# Patient Record
Sex: Female | Born: 1945 | Race: White | Hispanic: No | Marital: Married | Smoking: Never smoker
Health system: Southern US, Community
[De-identification: ages and names within clinical notes are randomized; demographics above are authoritative.]

## PROBLEM LIST (undated history)

## (undated) DIAGNOSIS — E785 Hyperlipidemia, unspecified: Secondary | ICD-10-CM

## (undated) DIAGNOSIS — E119 Type 2 diabetes mellitus without complications: Secondary | ICD-10-CM

## (undated) DIAGNOSIS — F419 Anxiety disorder, unspecified: Secondary | ICD-10-CM

## (undated) DIAGNOSIS — F32A Depression, unspecified: Secondary | ICD-10-CM

## (undated) DIAGNOSIS — F329 Major depressive disorder, single episode, unspecified: Secondary | ICD-10-CM

## (undated) DIAGNOSIS — Z8489 Family history of other specified conditions: Secondary | ICD-10-CM

## (undated) DIAGNOSIS — M1990 Unspecified osteoarthritis, unspecified site: Secondary | ICD-10-CM

## (undated) DIAGNOSIS — I739 Peripheral vascular disease, unspecified: Secondary | ICD-10-CM

## (undated) DIAGNOSIS — T8859XA Other complications of anesthesia, initial encounter: Secondary | ICD-10-CM

## (undated) DIAGNOSIS — M8580 Other specified disorders of bone density and structure, unspecified site: Secondary | ICD-10-CM

## (undated) DIAGNOSIS — I2510 Atherosclerotic heart disease of native coronary artery without angina pectoris: Secondary | ICD-10-CM

## (undated) DIAGNOSIS — G114 Hereditary spastic paraplegia: Secondary | ICD-10-CM

## (undated) DIAGNOSIS — T4145XA Adverse effect of unspecified anesthetic, initial encounter: Secondary | ICD-10-CM

## (undated) DIAGNOSIS — R112 Nausea with vomiting, unspecified: Secondary | ICD-10-CM

## (undated) DIAGNOSIS — Z9889 Other specified postprocedural states: Secondary | ICD-10-CM

## (undated) DIAGNOSIS — R251 Tremor, unspecified: Secondary | ICD-10-CM

## (undated) HISTORY — PX: EYE SURGERY: SHX253

## (undated) HISTORY — PX: FRACTURE SURGERY: SHX138

## (undated) HISTORY — PX: OTHER SURGICAL HISTORY: SHX169

## (undated) HISTORY — PX: TONSILLECTOMY: SUR1361

## (undated) HISTORY — PX: APPENDECTOMY: SHX54

## (undated) HISTORY — PX: COLONOSCOPY: SHX174

---
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
## 2012-02-14 ENCOUNTER — Ambulatory Visit: Payer: Self-pay | Admitting: Internal Medicine

## 2013-02-19 ENCOUNTER — Ambulatory Visit: Payer: Self-pay | Admitting: Internal Medicine

## 2013-05-30 ENCOUNTER — Ambulatory Visit: Payer: Self-pay | Admitting: Gastroenterology

## 2013-08-13 ENCOUNTER — Ambulatory Visit: Payer: Self-pay | Admitting: Ophthalmology

## 2013-08-28 ENCOUNTER — Ambulatory Visit: Payer: Self-pay | Admitting: Ophthalmology

## 2013-10-20 ENCOUNTER — Ambulatory Visit: Payer: Self-pay | Admitting: Otolaryngology

## 2014-02-21 ENCOUNTER — Ambulatory Visit: Payer: Self-pay | Admitting: Internal Medicine

## 2014-03-16 DIAGNOSIS — I779 Disorder of arteries and arterioles, unspecified: Secondary | ICD-10-CM | POA: Insufficient documentation

## 2014-03-16 DIAGNOSIS — E785 Hyperlipidemia, unspecified: Secondary | ICD-10-CM | POA: Insufficient documentation

## 2014-03-16 DIAGNOSIS — E119 Type 2 diabetes mellitus without complications: Secondary | ICD-10-CM | POA: Insufficient documentation

## 2014-03-16 DIAGNOSIS — E1169 Type 2 diabetes mellitus with other specified complication: Secondary | ICD-10-CM | POA: Insufficient documentation

## 2014-03-28 ENCOUNTER — Ambulatory Visit: Payer: Self-pay | Admitting: Internal Medicine

## 2014-06-10 DIAGNOSIS — G252 Other specified forms of tremor: Secondary | ICD-10-CM | POA: Insufficient documentation

## 2014-10-23 IMAGING — US THYROID ULTRASOUND
1 series · 14 of 25 positions shown · non-contrast
Comparison: None.

CLINICAL DATA: Thyroid nodule

EXAM:
THYROID ULTRASOUND
TECHNIQUE: Ultrasound examination of the thyroid gland and adjacent soft
tissues was performed.

[Series 1: thyroid ultrasound · 0.06mm/px · 14 of 56 slices shown]
[im 1/56]
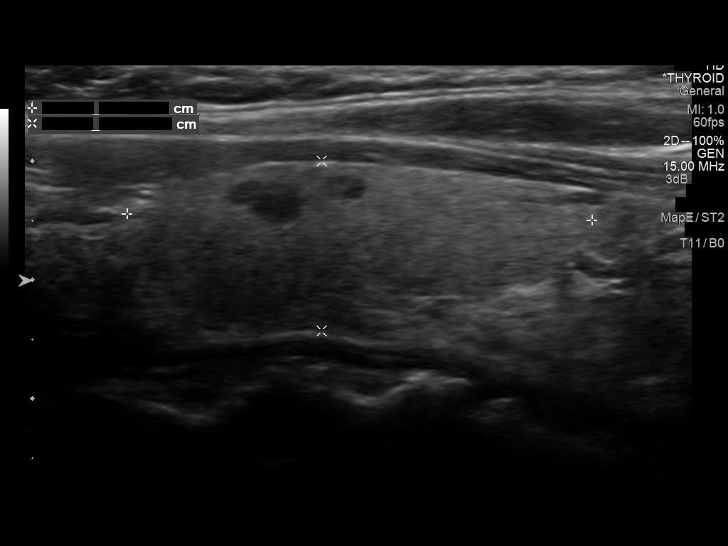
[im 5/56]
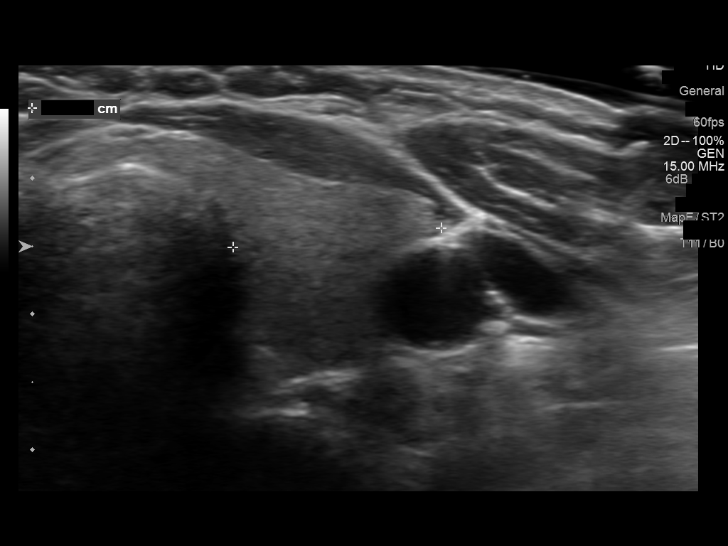
[im 10/56]
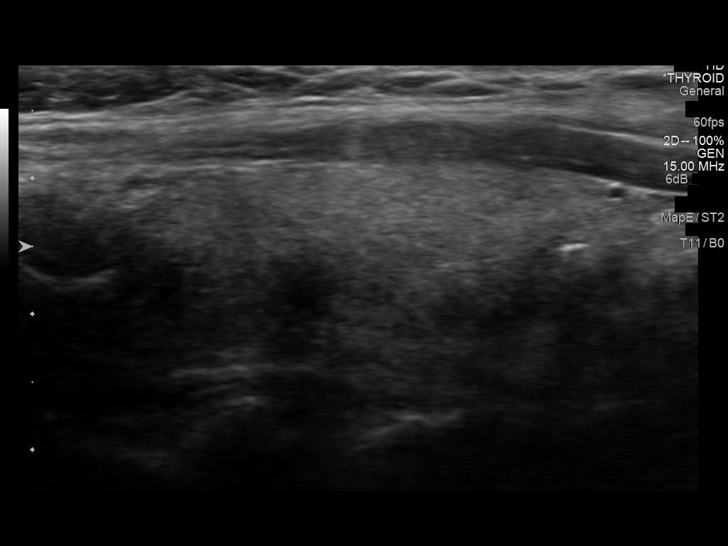
[im 14/56]
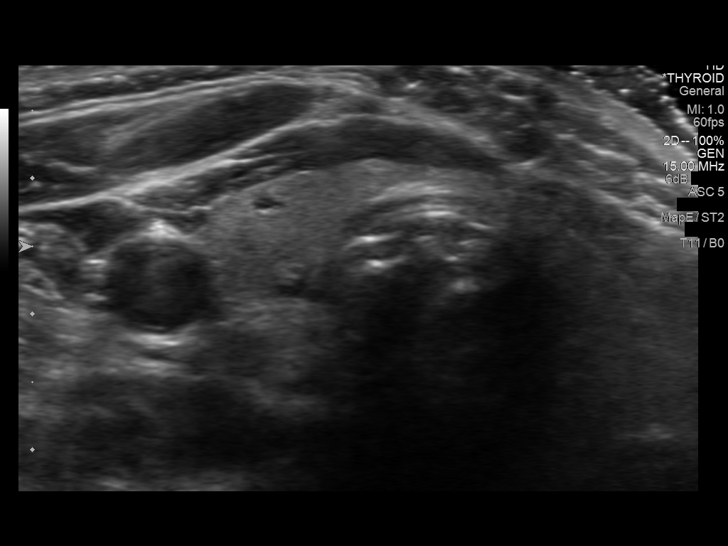
[im 19/56]
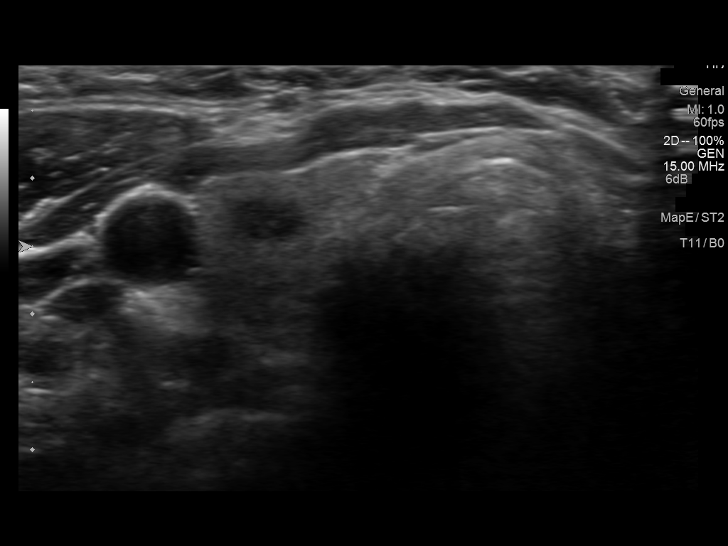
[im 21/56]
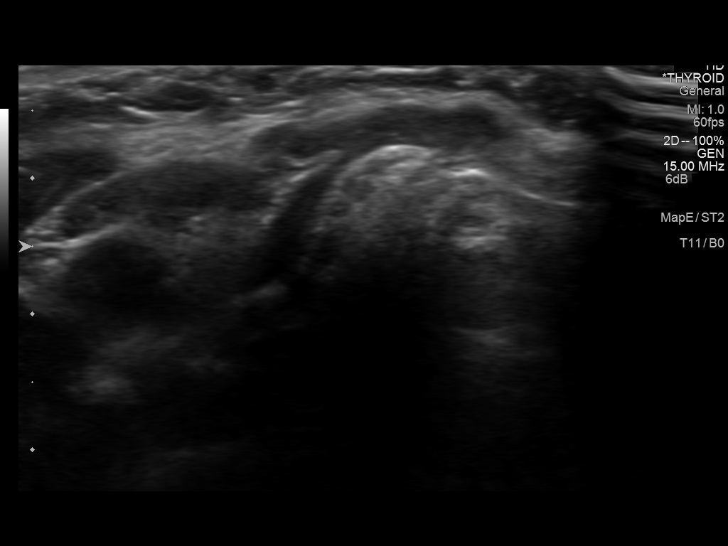
[im 26/56]
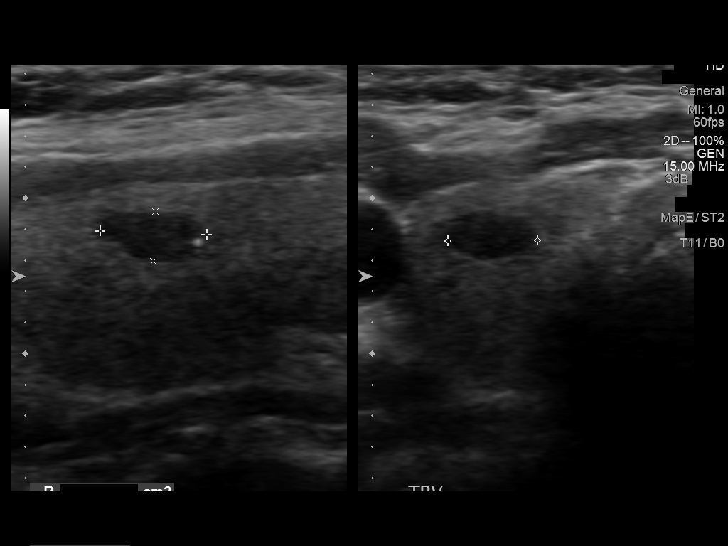
[im 30/56]
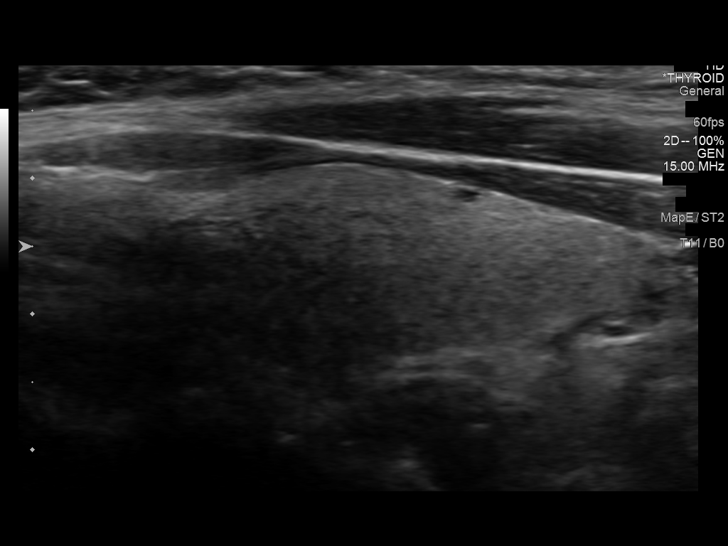
[im 35/56]
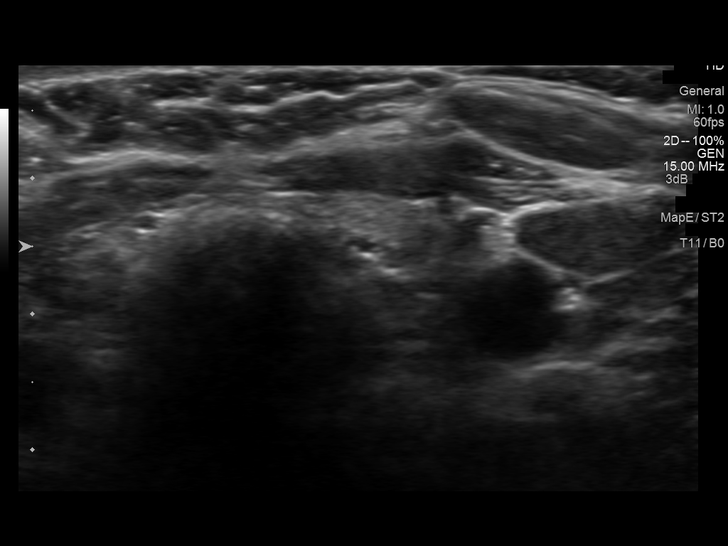
[im 37/56]
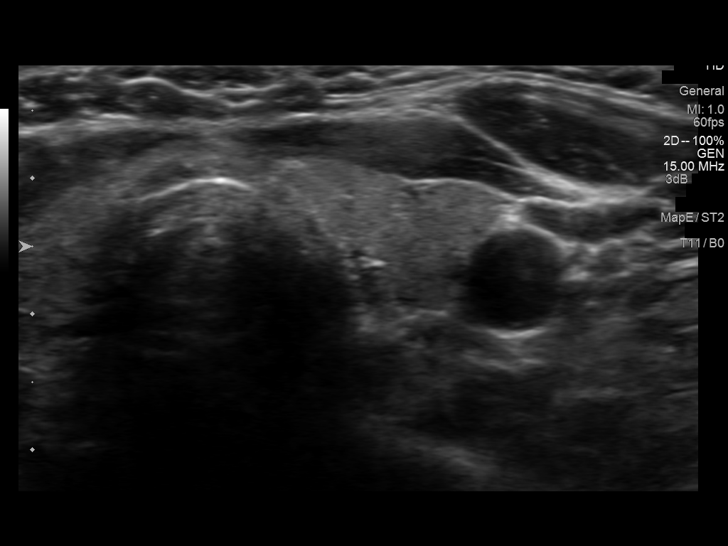
[im 42/56]
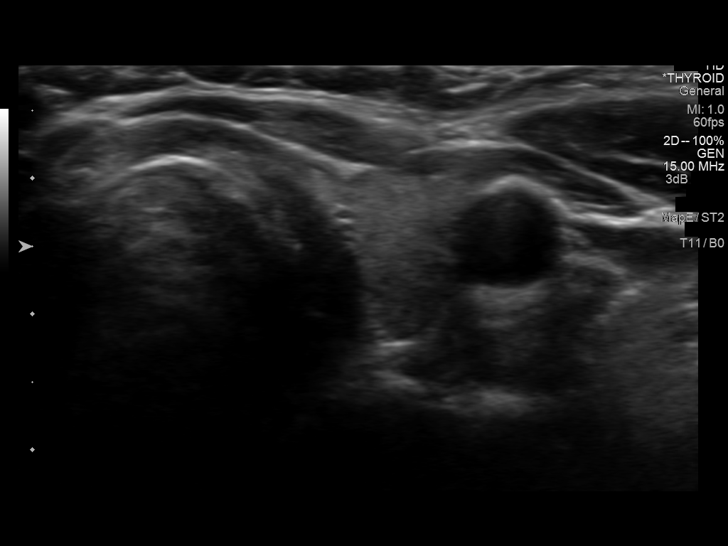
[im 46/56]
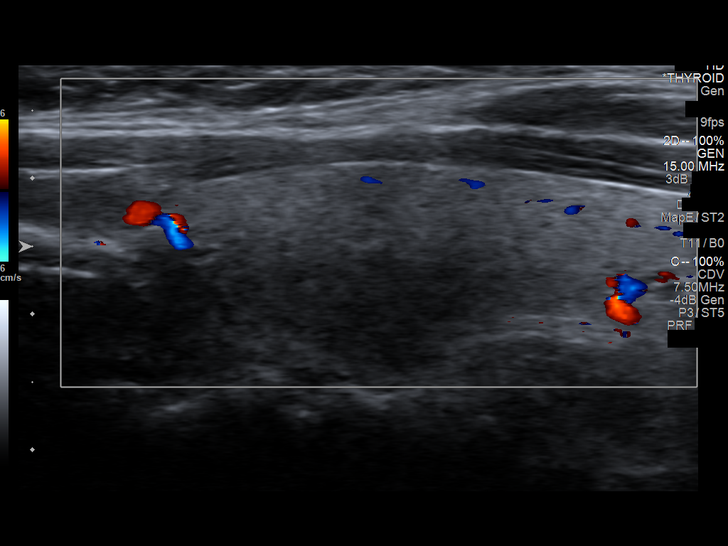
[im 51/56]
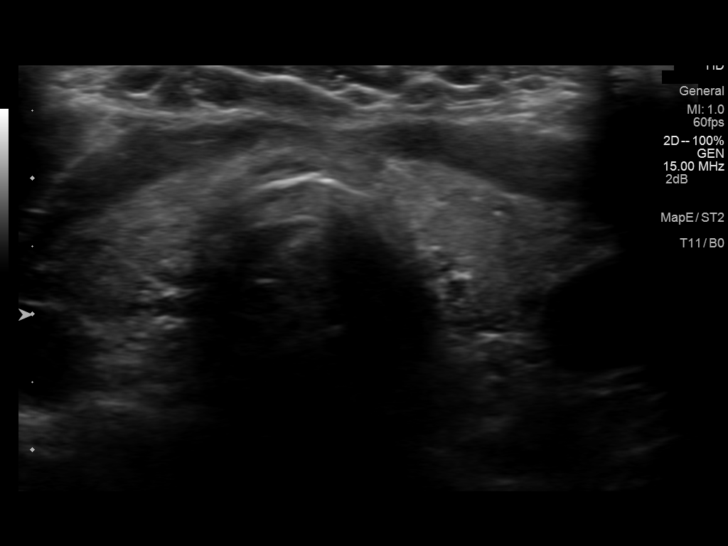
[im 56/56]
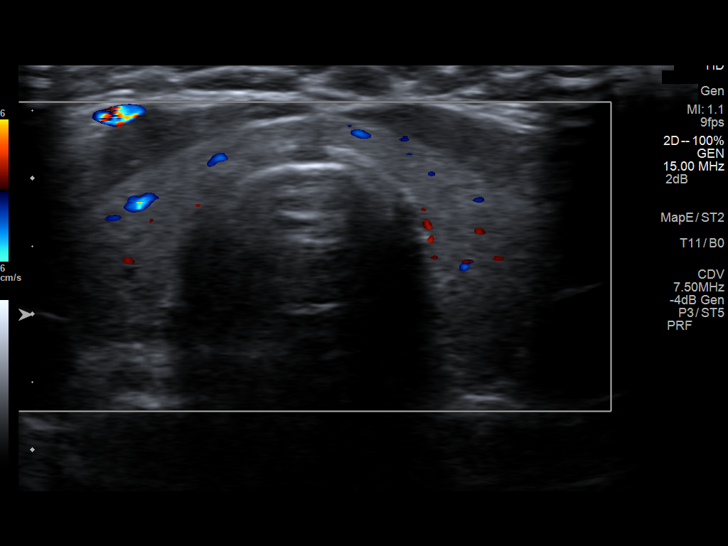

[14 of 25 positions shown; findings below may reference images not displayed]

FINDINGS: Right thyroid lobe

Measurements: 3.9 x 1.4 x 1.3 cm.. Scattered small hypoechoic
nodules are identified within the right lobe of the thyroid. The
largest of these measures 7 mm in lies in the upper pole.

Left thyroid lobe

Measurements: 4.0 x 1.4 x 1.5 cm.. Scattered nodules are noted in
the left lobe of the thyroid. The largest of these measures 6 mm in
the posterior aspect of the mid gland.

Isthmus

Thickness: 1.8 mm..  No nodules visualized.

Lymphadenopathy

None visualized.
IMPRESSION: Multiple small hypoechoic nodules.

Findings do not meet current SRU consensus criteria for biopsy.
Follow-up by clinical exam is recommended. If patient has known risk
factors for thyroid carcinoma, consider follow-up ultrasound in 12
months. If patient is clinically hyperthyroid, consider nuclear
medicine thyroid uptake and scan.

Reference: Management of Thyroid Nodules Detected at US: Society of
Radiologists in Ultrasound Consensus Conference Statement. Radiology

## 2015-03-25 ENCOUNTER — Other Ambulatory Visit: Payer: Self-pay | Admitting: Internal Medicine

## 2015-03-25 DIAGNOSIS — Z1231 Encounter for screening mammogram for malignant neoplasm of breast: Secondary | ICD-10-CM

## 2015-04-01 ENCOUNTER — Ambulatory Visit
Admission: RE | Admit: 2015-04-01 | Discharge: 2015-04-01 | Disposition: A | Discharge status: Home / Self Care | Payer: Medicare Other | Source: Ambulatory Visit | Attending: Internal Medicine | Admitting: Internal Medicine

## 2015-04-01 ENCOUNTER — Other Ambulatory Visit: Payer: Self-pay | Admitting: Internal Medicine

## 2015-04-01 DIAGNOSIS — Z1231 Encounter for screening mammogram for malignant neoplasm of breast: Secondary | ICD-10-CM | POA: Diagnosis not present

## 2015-10-28 DIAGNOSIS — Z8659 Personal history of other mental and behavioral disorders: Secondary | ICD-10-CM | POA: Insufficient documentation

## 2016-02-20 ENCOUNTER — Other Ambulatory Visit: Payer: Self-pay | Admitting: Internal Medicine

## 2016-02-20 DIAGNOSIS — Z1231 Encounter for screening mammogram for malignant neoplasm of breast: Secondary | ICD-10-CM

## 2016-04-13 ENCOUNTER — Ambulatory Visit
Admission: RE | Admit: 2016-04-13 | Discharge: 2016-04-13 | Disposition: A | Discharge status: Home / Self Care | Payer: Medicare Other | Source: Ambulatory Visit | Attending: Internal Medicine | Admitting: Internal Medicine

## 2016-04-13 ENCOUNTER — Other Ambulatory Visit: Payer: Self-pay | Admitting: Internal Medicine

## 2016-04-13 DIAGNOSIS — Z1231 Encounter for screening mammogram for malignant neoplasm of breast: Secondary | ICD-10-CM | POA: Diagnosis present

## 2016-04-26 DIAGNOSIS — M8589 Other specified disorders of bone density and structure, multiple sites: Secondary | ICD-10-CM | POA: Insufficient documentation

## 2016-06-05 ENCOUNTER — Emergency Department: Payer: Medicare Other

## 2016-06-05 ENCOUNTER — Encounter: Payer: Self-pay | Admitting: Emergency Medicine

## 2016-06-05 ENCOUNTER — Emergency Department
Admission: EM | Admit: 2016-06-05 | Discharge: 2016-06-05 | Disposition: A | Discharge status: Home / Self Care | Payer: Medicare Other | Attending: Student | Admitting: Student

## 2016-06-05 DIAGNOSIS — W010XXA Fall on same level from slipping, tripping and stumbling without subsequent striking against object, initial encounter: Secondary | ICD-10-CM | POA: Insufficient documentation

## 2016-06-05 DIAGNOSIS — S7002XA Contusion of left hip, initial encounter: Secondary | ICD-10-CM | POA: Diagnosis not present

## 2016-06-05 DIAGNOSIS — Y939 Activity, unspecified: Secondary | ICD-10-CM | POA: Insufficient documentation

## 2016-06-05 DIAGNOSIS — Y999 Unspecified external cause status: Secondary | ICD-10-CM | POA: Insufficient documentation

## 2016-06-05 DIAGNOSIS — Y929 Unspecified place or not applicable: Secondary | ICD-10-CM | POA: Insufficient documentation

## 2016-06-05 DIAGNOSIS — M25552 Pain in left hip: Secondary | ICD-10-CM

## 2016-06-05 DIAGNOSIS — W19XXXA Unspecified fall, initial encounter: Secondary | ICD-10-CM

## 2016-06-05 DIAGNOSIS — S79912A Unspecified injury of left hip, initial encounter: Secondary | ICD-10-CM | POA: Diagnosis present

## 2016-06-05 HISTORY — DX: Hereditary spastic paraplegia: G11.4

## 2016-06-05 HISTORY — DX: Hyperlipidemia, unspecified: E78.5

## 2016-06-05 HISTORY — DX: Unspecified osteoarthritis, unspecified site: M19.90

## 2016-06-05 HISTORY — DX: Anxiety disorder, unspecified: F41.9

## 2016-06-05 MED ORDER — ONDANSETRON 4 MG PO TBDP
ORAL_TABLET | ORAL | Status: AC
  Administered 2016-06-05: 4 mg via ORAL
  Filled 2016-06-05: qty 1

## 2016-06-05 MED ORDER — IBUPROFEN 600 MG PO TABS
ORAL_TABLET | ORAL | Status: AC
  Administered 2016-06-05: 600 mg via ORAL
  Filled 2016-06-05: qty 1

## 2016-06-05 MED ORDER — ONDANSETRON 4 MG PO TBDP
4.0000 mg | ORAL_TABLET | Freq: Once | ORAL | Status: AC
  Administered 2016-06-05: 4 mg via ORAL

## 2016-06-05 MED ORDER — CYCLOBENZAPRINE HCL 10 MG PO TABS
5.0000 mg | ORAL_TABLET | Freq: Once | ORAL | Status: AC
  Administered 2016-06-05: 5 mg via ORAL

## 2016-06-05 MED ORDER — IBUPROFEN 600 MG PO TABS
600.0000 mg | ORAL_TABLET | Freq: Once | ORAL | Status: AC
  Administered 2016-06-05: 600 mg via ORAL

## 2016-06-05 MED ORDER — IBUPROFEN 400 MG PO TABS: 400.0000 mg | ORAL_TABLET | Freq: Four times a day (QID) | ORAL | 0 refills | Status: DC | PRN

## 2016-06-05 MED ORDER — CYCLOBENZAPRINE HCL 10 MG PO TABS
ORAL_TABLET | ORAL | Status: AC
  Administered 2016-06-05: 5 mg via ORAL
  Filled 2016-06-05: qty 1

## 2016-06-05 NOTE — ED Provider Notes (Signed)
[facility] Emergency Department Provider Note   ____________________________________________   First MD Initiated Contact with Patient 06/05/16 1119     (approximate)  I have reviewed the triage vital signs and the nursing notes.   HISTORY  Chief Complaint Fall    HPI Joy Alexander is a 70 y.o. female with hyperlipidemia, hereditary spastic paraparesis who presents for evaluation of left hip pain after slip and fall which occurred suddenly just prior to arrival, constant, moderate and worse with movement of the left hip. Patient reports that she was sitting down having tea with several friends when she felt herself starting to slip off of a cushion she was sitting on. She tried to brace her fall by grabbing the table cloth but she fell onto the left hip. She not hit her head or lose consciousness. She has been able to bear weight on the left leg but is having difficulty with ambulation. She denies any other pain complaints. No chest pain or difficulty breathing, no abdominal pain, vomiting, diarrhea, fevers or chills per she is otherwise been in her usual state of health without illness.   Past Medical History:  Diagnosis Date  . Anxiety   . Arthritis   . Hereditary spastic paraparesis (HCC)   . Hyperlipemia     There are no active problems to display for this patient.   Past Surgical History:  Procedure Laterality Date  . APPENDECTOMY    . TONSILLECTOMY      Prior to Admission medications   Not on File    Allergies Review of patient's allergies indicates no known allergies.  Family History  Problem Relation Age of Onset  . Breast cancer Neg Hx     Social History Social History  Substance Use Topics  . Smoking status: Never Smoker  . Smokeless tobacco: Never Used  . Alcohol use Yes     Comment: 1-2 glasses of wine per week    Review of Systems Constitutional: No fever/chills Eyes: No visual changes. ENT: No sore  throat. Cardiovascular: Denies chest pain. Respiratory: Denies shortness of breath. Gastrointestinal: No abdominal pain.  No nausea, no vomiting.  No diarrhea.  No constipation. Genitourinary: Negative for dysuria. Musculoskeletal: Negative for back pain. Skin: Negative for rash. Neurological: Negative for headaches, focal weakness or numbness.  10-point ROS otherwise negative.  ____________________________________________   PHYSICAL EXAM:  VITAL SIGNS: ED Triage Vitals  Enc Vitals Group     BP 06/05/16 1116 (!) 180/88     Pulse Rate 06/05/16 1116 91     Resp 06/05/16 1116 18     Temp 06/05/16 1116 98.7 F (37.1 C)     Temp Source 06/05/16 1116 Oral     SpO2 06/05/16 1116 97 %     Weight 06/05/16 1117 127 lb (57.6 kg)     Height 06/05/16 1117 5\' 2"  (1.575 m)     Head Circumference --      Peak Flow --      Pain Score 06/05/16 1116 5     Pain Loc --      Pain Edu? --      Excl. in GC? --     Constitutional: Alert and oriented. Well appearing and in no acute distress. Eyes: Conjunctivae are normal. PERRL. EOMI. Head: Atraumatic. Nose: No congestion/rhinnorhea. Mouth/Throat: Mucous membranes are moist.  Oropharynx non-erythematous. Neck: No stridor.  No cervical spine tenderness to palpation. Cardiovascular: Normal rate, regular rhythm. Grossly normal heart sounds.  Good peripheral circulation.  Respiratory: Normal respiratory effort.  No retractions. Lungs CTAB. Gastrointestinal: Soft and nontender. No distention. No CVA tenderness. Genitourinary: deferred Musculoskeletal: Tenderness over the greater trochanter and the left thigh, full active range of motion of bilateral hip joints, 2+ DP pulses bilaterally, wiggles the toes. Pelvis is stable to rock and compression. No midline T or L-spine tenderness to palpation. Neurologic:  Normal speech and language. No gross focal neurologic deficits are appreciated. Skin:  Skin is warm, dry and intact. No rash noted. Psychiatric:  Mood and affect are normal. Speech and behavior are normal.  ____________________________________________   LABS (all labs ordered are listed, but only abnormal results are displayed)  Labs Reviewed - No data to display ____________________________________________  EKG  none ____________________________________________  RADIOLOGY  Xray left hip IMPRESSION: Bones appear somewhat osteoporotic. No fracture or dislocation. No evident arthropathy. ____________________________________________   PROCEDURES  Procedure(s) performed: None  Procedures  Critical Care performed: No  ____________________________________________   INITIAL IMPRESSION / ASSESSMENT AND PLAN / ED COURSE  Pertinent labs & imaging results that were available during my care of the patient were reviewed by me and considered in my medical decision making (see chart for details).  [name] is a 70 y.o. female with hyperlipidemia, hereditary spastic paraparesis who presents for evaluation of left hip pain after slip and fall which occurred suddenly just prior to arrival. On exam she is well-appearing and in no acute distress. Vital signs stable, she is afebrile. Neurovascularly intact in the left leg with full range of motion at the left hip, I doubt acute fracture or dislocation, possibly contusion given her point tenderness over the greater trochanter. We'll obtain plain films, treat her pain and reassess for disposition.  ----------------------------------------- 12:16 PM on 06/05/2016 ----------------------------------------- Plan films negative for acute fracture or dislocation. Patient ambulates well with a walker which she reports she has at home and uses periodically. I doubt an occult hip fracture. DC with return precautions and close PCP follow-up. She is comfortable with discharge plan.  Clinical Course     ____________________________________________   FINAL CLINICAL IMPRESSION(S)  / ED DIAGNOSES  Final diagnoses:  Fall, initial encounter  Acute hip pain, left      NEW MEDICATIONS STARTED DURING THIS VISIT:  New Prescriptions   No medications on file     Note:  This document was prepared using Dragon voice recognition software and may include unintentional dictation errors.    Gayla Doss, MD 06/05/16 [phone]

## 2016-06-05 NOTE — ED Triage Notes (Signed)
Patient slipped and fell this morning, landing on her left hip, no shortening or rotation noted, patient is able to bear weight, just not able to walk. Patient denies LOC or hitting head.

## 2016-06-05 NOTE — ED Notes (Signed)
Pt ambulated in hallway with walker without difficulty. EDP notified.

## 2016-06-05 NOTE — ED Notes (Signed)

## 2016-06-10 ENCOUNTER — Other Ambulatory Visit: Payer: Self-pay | Admitting: Internal Medicine

## 2016-06-10 DIAGNOSIS — M25552 Pain in left hip: Secondary | ICD-10-CM

## 2016-06-11 ENCOUNTER — Ambulatory Visit
Admission: RE | Admit: 2016-06-11 | Discharge: 2016-06-11 | Disposition: A | Discharge status: Home / Self Care | Payer: Medicare Other | Source: Ambulatory Visit | Attending: Internal Medicine | Admitting: Internal Medicine

## 2016-06-11 DIAGNOSIS — S32592A Other specified fracture of left pubis, initial encounter for closed fracture: Secondary | ICD-10-CM | POA: Diagnosis not present

## 2016-06-11 DIAGNOSIS — M25552 Pain in left hip: Secondary | ICD-10-CM | POA: Diagnosis present

## 2016-06-11 DIAGNOSIS — I700 Atherosclerosis of aorta: Secondary | ICD-10-CM | POA: Diagnosis not present

## 2016-06-11 DIAGNOSIS — X58XXXA Exposure to other specified factors, initial encounter: Secondary | ICD-10-CM | POA: Insufficient documentation

## 2017-04-28 ENCOUNTER — Other Ambulatory Visit: Payer: Self-pay | Admitting: Internal Medicine

## 2017-04-28 DIAGNOSIS — Z1231 Encounter for screening mammogram for malignant neoplasm of breast: Secondary | ICD-10-CM

## 2017-05-12 ENCOUNTER — Ambulatory Visit
Admission: RE | Admit: 2017-05-12 | Discharge: 2017-05-12 | Disposition: A | Discharge status: Home / Self Care | Payer: Medicare Other | Source: Ambulatory Visit | Attending: Internal Medicine | Admitting: Internal Medicine

## 2017-05-12 DIAGNOSIS — Z1231 Encounter for screening mammogram for malignant neoplasm of breast: Secondary | ICD-10-CM

## 2018-05-03 ENCOUNTER — Other Ambulatory Visit: Payer: Self-pay | Admitting: Internal Medicine

## 2018-05-03 DIAGNOSIS — Z1231 Encounter for screening mammogram for malignant neoplasm of breast: Secondary | ICD-10-CM

## 2018-05-15 ENCOUNTER — Ambulatory Visit
Admission: RE | Admit: 2018-05-15 | Discharge: 2018-05-15 | Disposition: A | Discharge status: Home / Self Care | Payer: Medicare Other | Source: Ambulatory Visit | Attending: Internal Medicine | Admitting: Internal Medicine

## 2018-05-15 DIAGNOSIS — Z1231 Encounter for screening mammogram for malignant neoplasm of breast: Secondary | ICD-10-CM | POA: Insufficient documentation

## 2018-05-30 DIAGNOSIS — Z9989 Dependence on other enabling machines and devices: Secondary | ICD-10-CM | POA: Insufficient documentation

## 2018-09-25 ENCOUNTER — Other Ambulatory Visit: Payer: Self-pay | Admitting: Surgery

## 2018-09-25 ENCOUNTER — Encounter: Payer: Self-pay | Admitting: *Deleted

## 2018-09-25 DIAGNOSIS — M7502 Adhesive capsulitis of left shoulder: Secondary | ICD-10-CM

## 2018-09-26 ENCOUNTER — Encounter: Admission: RE | Payer: Self-pay | Source: Home / Self Care

## 2018-09-26 ENCOUNTER — Ambulatory Visit: Admission: RE | Admit: 2018-09-26 | Payer: Medicare Other | Source: Home / Self Care | Admitting: Internal Medicine

## 2018-09-26 HISTORY — DX: Major depressive disorder, single episode, unspecified: F32.9

## 2018-09-26 HISTORY — DX: Other specified disorders of bone density and structure, unspecified site: M85.80

## 2018-09-26 HISTORY — DX: Type 2 diabetes mellitus without complications: E11.9

## 2018-09-26 HISTORY — DX: Depression, unspecified: F32.A

## 2018-09-26 HISTORY — DX: Atherosclerotic heart disease of native coronary artery without angina pectoris: I25.10

## 2018-09-26 SURGERY — COLONOSCOPY WITH PROPOFOL
Anesthesia: General

## 2018-10-04 ENCOUNTER — Ambulatory Visit
Admission: RE | Admit: 2018-10-04 | Discharge: 2018-10-04 | Disposition: A | Discharge status: Home / Self Care | Payer: Medicare Other | Source: Ambulatory Visit | Attending: Surgery | Admitting: Surgery

## 2018-10-04 DIAGNOSIS — M7502 Adhesive capsulitis of left shoulder: Secondary | ICD-10-CM | POA: Diagnosis not present

## 2018-11-21 ENCOUNTER — Encounter: Admission: RE | Payer: Self-pay | Source: Home / Self Care

## 2018-11-21 ENCOUNTER — Ambulatory Visit: Admission: RE | Admit: 2018-11-21 | Payer: Medicare Other | Source: Home / Self Care | Admitting: Internal Medicine

## 2018-11-21 SURGERY — COLONOSCOPY WITH PROPOFOL
Anesthesia: General

## 2019-03-04 DIAGNOSIS — Z01810 Encounter for preprocedural cardiovascular examination: Secondary | ICD-10-CM | POA: Diagnosis not present

## 2019-03-05 ENCOUNTER — Other Ambulatory Visit: Payer: Self-pay

## 2019-03-05 ENCOUNTER — Encounter
Admission: RE | Admit: 2019-03-05 | Discharge: 2019-03-05 | Disposition: A | Discharge status: Home / Self Care | Payer: Medicare Other | Source: Ambulatory Visit | Attending: Surgery | Admitting: Surgery

## 2019-03-05 DIAGNOSIS — S0003XA Contusion of scalp, initial encounter: Secondary | ICD-10-CM | POA: Diagnosis not present

## 2019-03-05 DIAGNOSIS — Z1159 Encounter for screening for other viral diseases: Secondary | ICD-10-CM | POA: Diagnosis not present

## 2019-03-05 DIAGNOSIS — Y92530 Ambulatory surgery center as the place of occurrence of the external cause: Secondary | ICD-10-CM | POA: Diagnosis not present

## 2019-03-05 DIAGNOSIS — Z5309 Procedure and treatment not carried out because of other contraindication: Secondary | ICD-10-CM | POA: Diagnosis not present

## 2019-03-05 DIAGNOSIS — W19XXXA Unspecified fall, initial encounter: Secondary | ICD-10-CM | POA: Diagnosis not present

## 2019-03-05 DIAGNOSIS — M7502 Adhesive capsulitis of left shoulder: Secondary | ICD-10-CM | POA: Diagnosis present

## 2019-03-05 DIAGNOSIS — Z9181 History of falling: Secondary | ICD-10-CM | POA: Diagnosis not present

## 2019-03-05 HISTORY — DX: Nausea with vomiting, unspecified: R11.2

## 2019-03-05 HISTORY — DX: Peripheral vascular disease, unspecified: I73.9

## 2019-03-05 HISTORY — DX: Other specified postprocedural states: Z98.890

## 2019-03-05 HISTORY — DX: Tremor, unspecified: R25.1

## 2019-03-05 HISTORY — DX: Family history of other specified conditions: Z84.89

## 2019-03-05 HISTORY — DX: Other complications of anesthesia, initial encounter: T88.59XA

## 2019-03-05 LAB — SARS CORONAVIRUS 2 (TAT 6-24 HRS): SARS Coronavirus 2: NEGATIVE

## 2019-03-05 NOTE — Patient Instructions (Signed)
Your procedure is scheduled on: thurs. 7/9 Report to Day Surgery. To find out your arrival time please call [phone] between 1PM - 3PM on Wed. 7/8.  Remember: Instructions that are not followed completely may result in serious medical risk,  up to and including death, or upon the discretion of your surgeon and anesthesiologist your  surgery may need to be rescheduled.     _X__ 1. Do not eat food after midnight the night before your procedure.                 No gum chewing or hard candies. You may drink clear liquids up to 2 hours                 before you are scheduled to arrive for your surgery- DO not drink clear                 liquids within 2 hours of the start of your surgery.                 Clear Liquids include:  water, apple juice without pulp, clear carbohydrate                 drink such as Clearfast of Gatorade, Black Coffee or Tea (Do not add                 anything to coffee or tea).  __X__2.  On the morning of surgery brush your teeth with toothpaste and water, you                may rinse your mouth with mouthwash if you wish.  Do not swallow any toothpaste of mouthwash.     _X__ 3.  No Alcohol for 24 hours before or after surgery.   ___ 4.  Do Not Smoke or use e-cigarettes For 24 Hours Prior to Your Surgery.                 Do not use any chewable tobacco products for at least 6 hours prior to                 surgery.  ____  5.  Bring all medications with you on the day of surgery if instructed.   _x___  6.  Notify your doctor if there is any change in your medical condition      (cold, fever, infections).     Do not wear jewelry, make-up, hairpins, clips or nail polish. Do not wear lotions, powders, or perfumes. You may wear deodorant. Do not shave 48 hours prior to surgery. Men may shave face and neck. Do not bring valuables to the hospital.    [facility] is not responsible for any belongings or valuables.  Contacts, dentures  or bridgework may not be worn into surgery. Leave your suitcase in the car. After surgery it may be brought to your room. For patients admitted to the hospital, discharge time is determined by your treatment team.   Patients discharged the day of surgery will not be allowed to drive home.   Please read over the following fact sheets that you were given:     _x___ Take these medicines the morning of surgery with A SIP OF WATER:    1. acetaminophen (TYLENOL 8 HOUR) 650 MG CR tablet  2. escitalopram (LEXAPRO) 20 MG tablet  3. primidone (MYSOLINE) 50 MG tablet  4.  5.  6.  ____ Fleet Enema (as directed)  __x__ Use CHG Soap as directed  ____ Use inhalers on the day of surgery  ____ Stop metformin 2 days prior to surgery    ____ Take 1/2 of usual insulin dose the night before surgery. No insulin the morning          of surgery.   _x___ Stop aspirin today   __x__ Stop Anti-inflammatories ibuprofen (ADVIL) 200 MG tablet today   ____ Stop supplements until after surgery.    ____ Bring C-Pap to the hospital.

## 2019-03-08 ENCOUNTER — Encounter: Payer: Self-pay | Admitting: Anesthesiology

## 2019-03-08 ENCOUNTER — Encounter
Admission: RE | Disposition: A | Discharge status: Home / Self Care | Payer: Self-pay | Source: Home / Self Care | Attending: Surgery

## 2019-03-08 ENCOUNTER — Encounter: Payer: Self-pay | Admitting: Emergency Medicine

## 2019-03-08 ENCOUNTER — Ambulatory Visit
Admission: RE | Admit: 2019-03-08 | Discharge: 2019-03-08 | Disposition: A | Discharge status: Home / Self Care | Payer: Medicare Other | Attending: Surgery | Admitting: Surgery

## 2019-03-08 ENCOUNTER — Other Ambulatory Visit: Payer: Self-pay

## 2019-03-08 DIAGNOSIS — Z5309 Procedure and treatment not carried out because of other contraindication: Secondary | ICD-10-CM | POA: Insufficient documentation

## 2019-03-08 DIAGNOSIS — M7502 Adhesive capsulitis of left shoulder: Secondary | ICD-10-CM | POA: Diagnosis not present

## 2019-03-08 DIAGNOSIS — S0003XA Contusion of scalp, initial encounter: Secondary | ICD-10-CM | POA: Insufficient documentation

## 2019-03-08 DIAGNOSIS — Z1159 Encounter for screening for other viral diseases: Secondary | ICD-10-CM | POA: Insufficient documentation

## 2019-03-08 DIAGNOSIS — W19XXXA Unspecified fall, initial encounter: Secondary | ICD-10-CM | POA: Insufficient documentation

## 2019-03-08 DIAGNOSIS — Z9181 History of falling: Secondary | ICD-10-CM | POA: Insufficient documentation

## 2019-03-08 DIAGNOSIS — Y92530 Ambulatory surgery center as the place of occurrence of the external cause: Secondary | ICD-10-CM | POA: Insufficient documentation

## 2019-03-08 LAB — GLUCOSE, CAPILLARY: Glucose-Capillary: 125 mg/dL — ABNORMAL HIGH (ref 70–99)

## 2019-03-08 SURGERY — CLOSED MANIPULATION SHOULDER WITH STEROID INJECTION
Anesthesia: Choice | Laterality: Left

## 2019-03-08 MED ORDER — FAMOTIDINE 20 MG PO TABS: 20.0000 mg | ORAL_TABLET | Freq: Once | ORAL | Status: DC

## 2019-03-08 MED ORDER — SODIUM CHLORIDE 0.9 % IV SOLN
INTRAVENOUS | Status: DC
  Administered 2019-03-08: 08:00:00 via INTRAVENOUS

## 2019-03-08 MED ORDER — SCOPOLAMINE 1 MG/3DAYS TD PT72: 1.0000 | MEDICATED_PATCH | Freq: Once | TRANSDERMAL | Status: DC

## 2019-03-08 SURGICAL SUPPLY — 47 items
BIT DRILL JUGRKNT W/NDL BIT2.9 (DRILL) IMPLANT
BLADE FULL RADIUS 3.5 (BLADE) ×3 IMPLANT
BNDG ADH 2 X3.75 FABRIC TAN LF (GAUZE/BANDAGES/DRESSINGS) ×3 IMPLANT
BUR ACROMIONIZER 4.0 (BURR) ×3 IMPLANT
CANNULA SHAVER 8MMX76MM (CANNULA) ×3 IMPLANT
CHLORAPREP W/TINT 26 (MISCELLANEOUS) ×3 IMPLANT
COVER MAYO STAND STRL (DRAPES) ×3 IMPLANT
COVER WAND RF STERILE (DRAPES) ×3 IMPLANT
DRAPE IMP U-DRAPE 54X76 (DRAPES) ×6 IMPLANT
DRILL JUGGERKNOT W/NDL BIT 2.9 (DRILL)
ELECT REM PT RETURN 9FT ADLT (ELECTROSURGICAL) ×3
ELECTRODE REM PT RTRN 9FT ADLT (ELECTROSURGICAL) ×1 IMPLANT
GAUZE SPONGE 4X4 12PLY STRL (GAUZE/BANDAGES/DRESSINGS) ×3 IMPLANT
GAUZE XEROFORM 1X8 LF (GAUZE/BANDAGES/DRESSINGS) ×3 IMPLANT
GLOVE BIO SURGEON STRL SZ7.5 (GLOVE) ×6 IMPLANT
GLOVE BIO SURGEON STRL SZ8 (GLOVE) ×6 IMPLANT
GLOVE BIOGEL PI IND STRL 8 (GLOVE) ×1 IMPLANT
GLOVE BIOGEL PI INDICATOR 8 (GLOVE) ×2
GLOVE INDICATOR 8.0 STRL GRN (GLOVE) ×3 IMPLANT
GOWN STRL REUS W/ TWL LRG LVL3 (GOWN DISPOSABLE) ×1 IMPLANT
GOWN STRL REUS W/ TWL XL LVL3 (GOWN DISPOSABLE) ×1 IMPLANT
GOWN STRL REUS W/TWL LRG LVL3 (GOWN DISPOSABLE) ×2
GOWN STRL REUS W/TWL XL LVL3 (GOWN DISPOSABLE) ×2
GRASPER SUT 15 45D LOW PRO (SUTURE) IMPLANT
IV LACTATED RINGER IRRG 3000ML (IV SOLUTION) ×2
IV LR IRRIG 3000ML ARTHROMATIC (IV SOLUTION) ×1 IMPLANT
KIT TURNOVER KIT A (KITS) ×3 IMPLANT
MANIFOLD NEPTUNE II (INSTRUMENTS) ×3 IMPLANT
MASK FACE SPIDER DISP (MASK) ×3 IMPLANT
MAT ABSORB  FLUID 56X50 GRAY (MISCELLANEOUS) ×2
MAT ABSORB FLUID 56X50 GRAY (MISCELLANEOUS) ×1 IMPLANT
NEEDLE HYPO 21X1.5 SAFETY (NEEDLE) ×3 IMPLANT
NEEDLE HYPO 22GX1.5 SAFETY (NEEDLE) ×3 IMPLANT
PACK ARTHROSCOPY SHOULDER (MISCELLANEOUS) ×3 IMPLANT
PAD ALCOHOL SWAB (MISCELLANEOUS) ×6 IMPLANT
SLING ARM LRG DEEP (SOFTGOODS) ×3 IMPLANT
SLING ARM M TX990204 (SOFTGOODS) ×3 IMPLANT
SLING ULTRA II LG (MISCELLANEOUS) ×3 IMPLANT
STAPLER SKIN PROX 35W (STAPLE) ×3 IMPLANT
STRAP SAFETY 5IN WIDE (MISCELLANEOUS) ×3 IMPLANT
SUT ETHIBOND 0 MO6 C/R (SUTURE) ×3 IMPLANT
SUT VIC AB 2-0 CT1 27 (SUTURE) ×4
SUT VIC AB 2-0 CT1 TAPERPNT 27 (SUTURE) ×2 IMPLANT
SYR 10ML LL (SYRINGE) ×3 IMPLANT
TAPE MICROFOAM 4IN (TAPE) ×3 IMPLANT
TUBING ARTHRO INFLOW-ONLY STRL (TUBING) ×3 IMPLANT
WAND WEREWOLF FLOW 90D (MISCELLANEOUS) ×3 IMPLANT

## 2019-03-08 NOTE — H&P (Signed)
While coming into the hospital this morning, the patient apparently was witnessed "crumpling to the ground" where she struck the back of her head on the floor.  Apparently she had taken a benzodiazepine on an empty stomach before coming into the hospital.  Although she did not lose consciousness, Anesthesia is concerned as to the patient having a possible concussion.  With general anesthesia, we would be unable to determine whether any grogginess would result from surgery or from an evolving issue resulting from her fall.  Given that she would not be able to be observed for any postconcussive symptoms, Anesthesia has recommended that we hold off on any surgery today.  Therefore, the patient will be canceled today and rescheduled for sometime in the near future.

## 2019-03-08 NOTE — Progress Notes (Signed)
Patient fell in the lobby at the screening desk in the medical mall around [place]. The screening nurse did witness the fall and said she crumpled to the ground and did strike her head. Patient wheeled to same day surgery. Patient was changed into a gown by this RN and Technical brewer. Patient did not have any visible injury to the body. Does have a hematoma to the back of the head with a knot. Patient states she falls a lot at home and usually tries to find something soft to land on. Patient states Dr Roland Rack gave her Ativan to take prior to her MRI she had prior to today and she took that pill prior to coming in on a empty stomach. Dr Roland Rack notified via his OR nurse. This RN spoke with Dr Lavone Neri anesthesia and updated him on the issue. Dr Lavone Neri spoke with patient and then with Dr Roland Rack and they agreed to cancel surgery. Dr Lavone Neri spoke with patient and told her the signs and symptoms to watch for and also spoke with husband and gave him the same instructions. Dr Roland Rack did come in and speak with patient. This RN did offer to take patient to the ED for evaluation but she declined and stated, "I feel fine to go home." Patients IV removed and changed back into her clothes. Patient stated they live in [facility] independent living and a nurse checks in on them. I instructed her to notify the nurse of today's events so they can check on her as well. Instructed patient to go to the ED if she had any of the symptoms Dr Lavone Neri discussed. Voiced understand.

## 2019-03-19 ENCOUNTER — Other Ambulatory Visit: Payer: Self-pay

## 2019-03-19 ENCOUNTER — Other Ambulatory Visit
Admission: RE | Admit: 2019-03-19 | Discharge: 2019-03-19 | Disposition: A | Discharge status: Home / Self Care | Payer: Medicare Other | Source: Ambulatory Visit | Attending: Surgery | Admitting: Surgery

## 2019-03-19 DIAGNOSIS — Z1159 Encounter for screening for other viral diseases: Secondary | ICD-10-CM | POA: Insufficient documentation

## 2019-03-20 LAB — SARS CORONAVIRUS 2 (TAT 6-24 HRS): SARS Coronavirus 2: NEGATIVE

## 2019-03-22 ENCOUNTER — Ambulatory Visit: Payer: Medicare Other | Admitting: Anesthesiology

## 2019-03-22 ENCOUNTER — Encounter
Admission: RE | Disposition: A | Discharge status: Home / Self Care | Payer: Self-pay | Source: Home / Self Care | Attending: Surgery

## 2019-03-22 ENCOUNTER — Ambulatory Visit
Admission: RE | Admit: 2019-03-22 | Discharge: 2019-03-22 | Disposition: A | Discharge status: Home / Self Care | Payer: Medicare Other | Attending: Surgery | Admitting: Surgery

## 2019-03-22 ENCOUNTER — Encounter: Payer: Self-pay | Admitting: *Deleted

## 2019-03-22 ENCOUNTER — Other Ambulatory Visit: Payer: Self-pay

## 2019-03-22 DIAGNOSIS — E785 Hyperlipidemia, unspecified: Secondary | ICD-10-CM | POA: Insufficient documentation

## 2019-03-22 DIAGNOSIS — Z7982 Long term (current) use of aspirin: Secondary | ICD-10-CM | POA: Diagnosis not present

## 2019-03-22 DIAGNOSIS — E1140 Type 2 diabetes mellitus with diabetic neuropathy, unspecified: Secondary | ICD-10-CM | POA: Insufficient documentation

## 2019-03-22 DIAGNOSIS — Z833 Family history of diabetes mellitus: Secondary | ICD-10-CM | POA: Diagnosis not present

## 2019-03-22 DIAGNOSIS — M7502 Adhesive capsulitis of left shoulder: Secondary | ICD-10-CM | POA: Diagnosis not present

## 2019-03-22 DIAGNOSIS — Z9849 Cataract extraction status, unspecified eye: Secondary | ICD-10-CM | POA: Insufficient documentation

## 2019-03-22 DIAGNOSIS — M810 Age-related osteoporosis without current pathological fracture: Secondary | ICD-10-CM | POA: Insufficient documentation

## 2019-03-22 DIAGNOSIS — Z79899 Other long term (current) drug therapy: Secondary | ICD-10-CM | POA: Diagnosis not present

## 2019-03-22 DIAGNOSIS — M1990 Unspecified osteoarthritis, unspecified site: Secondary | ICD-10-CM | POA: Insufficient documentation

## 2019-03-22 DIAGNOSIS — F329 Major depressive disorder, single episode, unspecified: Secondary | ICD-10-CM | POA: Diagnosis not present

## 2019-03-22 DIAGNOSIS — G114 Hereditary spastic paraplegia: Secondary | ICD-10-CM | POA: Diagnosis not present

## 2019-03-22 DIAGNOSIS — F419 Anxiety disorder, unspecified: Secondary | ICD-10-CM | POA: Insufficient documentation

## 2019-03-22 DIAGNOSIS — E1151 Type 2 diabetes mellitus with diabetic peripheral angiopathy without gangrene: Secondary | ICD-10-CM | POA: Diagnosis not present

## 2019-03-22 HISTORY — PX: CLOSED MANIPULATION SHOULDER WITH STERIOD INJECTION: SHX5611

## 2019-03-22 LAB — GLUCOSE, CAPILLARY: Glucose-Capillary: 121 mg/dL — ABNORMAL HIGH (ref 70–99)

## 2019-03-22 SURGERY — CLOSED MANIPULATION SHOULDER WITH STEROID INJECTION
Anesthesia: General | Site: Shoulder | Laterality: Left

## 2019-03-22 MED ORDER — FENTANYL CITRATE (PF) 100 MCG/2ML IJ SOLN
INTRAMUSCULAR | Status: AC
  Administered 2019-03-22: 25 ug via INTRAVENOUS
  Filled 2019-03-22: qty 2

## 2019-03-22 MED ORDER — LIDOCAINE HCL (PF) 1 % IJ SOLN
INTRAMUSCULAR | Status: AC
  Filled 2019-03-22: qty 5

## 2019-03-22 MED ORDER — FENTANYL CITRATE (PF) 100 MCG/2ML IJ SOLN
25.0000 ug | Freq: Once | INTRAMUSCULAR | Status: AC
  Administered 2019-03-22: 10:00:00 25 ug via INTRAVENOUS

## 2019-03-22 MED ORDER — ROPIVACAINE HCL 5 MG/ML IJ SOLN
INTRAMUSCULAR | Status: AC
  Filled 2019-03-22: qty 30

## 2019-03-22 MED ORDER — FENTANYL CITRATE (PF) 100 MCG/2ML IJ SOLN
INTRAMUSCULAR | Status: AC
  Filled 2019-03-22: qty 2

## 2019-03-22 MED ORDER — BUPIVACAINE-EPINEPHRINE (PF) 0.25% -1:200000 IJ SOLN
INTRAMUSCULAR | Status: DC | PRN
  Administered 2019-03-22: 9 mL via PERINEURAL

## 2019-03-22 MED ORDER — MIDAZOLAM HCL 2 MG/2ML IJ SOLN
INTRAMUSCULAR | Status: AC
  Administered 2019-03-22: 1 mg via INTRAVENOUS
  Filled 2019-03-22: qty 2

## 2019-03-22 MED ORDER — LIDOCAINE HCL (PF) 1 % IJ SOLN
INTRAMUSCULAR | Status: DC | PRN
  Administered 2019-03-22: .8 mL

## 2019-03-22 MED ORDER — ONDANSETRON HCL 4 MG/2ML IJ SOLN
4.0000 mg | Freq: Once | INTRAMUSCULAR | Status: AC | PRN
  Administered 2019-03-22: 4 mg via INTRAVENOUS

## 2019-03-22 MED ORDER — PROPOFOL 10 MG/ML IV BOLUS
INTRAVENOUS | Status: DC | PRN
  Administered 2019-03-22 (×2): 30 mg via INTRAVENOUS

## 2019-03-22 MED ORDER — ONDANSETRON HCL 4 MG/2ML IJ SOLN
INTRAMUSCULAR | Status: AC
  Filled 2019-03-22: qty 2

## 2019-03-22 MED ORDER — MIDAZOLAM HCL 2 MG/2ML IJ SOLN
1.0000 mg | Freq: Once | INTRAMUSCULAR | Status: AC
  Administered 2019-03-22: 1 mg via INTRAVENOUS

## 2019-03-22 MED ORDER — CEFAZOLIN SODIUM-DEXTROSE 2-4 GM/100ML-% IV SOLN
INTRAVENOUS | Status: AC
  Filled 2019-03-22: qty 100

## 2019-03-22 MED ORDER — CEFAZOLIN SODIUM-DEXTROSE 2-4 GM/100ML-% IV SOLN: 2.0000 g | Freq: Once | INTRAVENOUS | Status: DC

## 2019-03-22 MED ORDER — TRIAMCINOLONE ACETONIDE 40 MG/ML IJ SUSP
INTRAMUSCULAR | Status: AC
  Filled 2019-03-22: qty 1

## 2019-03-22 MED ORDER — SODIUM CHLORIDE 0.9 % IV SOLN
Freq: Once | INTRAVENOUS | Status: AC
  Administered 2019-03-22: 09:00:00 via INTRAVENOUS

## 2019-03-22 MED ORDER — PROPOFOL 10 MG/ML IV BOLUS
INTRAVENOUS | Status: AC
  Filled 2019-03-22: qty 20

## 2019-03-22 MED ORDER — LACTATED RINGERS IV SOLN
INTRAVENOUS | Status: DC | PRN
  Administered 2019-03-22: 10:00:00 via INTRAVENOUS

## 2019-03-22 MED ORDER — FENTANYL CITRATE (PF) 100 MCG/2ML IJ SOLN
INTRAMUSCULAR | Status: DC | PRN
  Administered 2019-03-22: 50 ug via INTRAVENOUS

## 2019-03-22 MED ORDER — MIDAZOLAM HCL 2 MG/2ML IJ SOLN
INTRAMUSCULAR | Status: AC
  Filled 2019-03-22: qty 2

## 2019-03-22 MED ORDER — BUPIVACAINE-EPINEPHRINE (PF) 0.25% -1:200000 IJ SOLN
INTRAMUSCULAR | Status: AC
  Filled 2019-03-22: qty 30

## 2019-03-22 MED ORDER — ROPIVACAINE HCL 5 MG/ML IJ SOLN
INTRAMUSCULAR | Status: DC | PRN
  Administered 2019-03-22: 30 mL via PERINEURAL

## 2019-03-22 MED ORDER — TRIAMCINOLONE ACETONIDE 40 MG/ML IJ SUSP
INTRAMUSCULAR | Status: DC | PRN
  Administered 2019-03-22: 1 mg via INTRAMUSCULAR

## 2019-03-22 MED ORDER — EPINEPHRINE PF 1 MG/ML IJ SOLN
INTRAMUSCULAR | Status: AC
  Filled 2019-03-22: qty 1

## 2019-03-22 MED ORDER — MIDAZOLAM HCL 2 MG/2ML IJ SOLN
INTRAMUSCULAR | Status: DC | PRN
  Administered 2019-03-22: 2 mg via INTRAVENOUS

## 2019-03-22 SURGICAL SUPPLY — 48 items
BIT DRILL JUGRKNT W/NDL BIT2.9 (DRILL) IMPLANT
BLADE FULL RADIUS 3.5 (BLADE) ×1 IMPLANT
BNDG ADH 2 X3.75 FABRIC TAN LF (GAUZE/BANDAGES/DRESSINGS) ×1 IMPLANT
BUR ACROMIONIZER 4.0 (BURR) ×1 IMPLANT
CANNULA SHAVER 8MMX76MM (CANNULA) ×1 IMPLANT
CHLORAPREP W/TINT 26 (MISCELLANEOUS) ×1 IMPLANT
COVER MAYO STAND REUSABLE (DRAPES) ×1 IMPLANT
COVER WAND RF STERILE (DRAPES) ×1 IMPLANT
DRAPE SPLIT 6X30 W/TAPE (DRAPES) ×2 IMPLANT
DRILL JUGGERKNOT W/NDL BIT 2.9 (DRILL)
ELECT REM PT RETURN 9FT ADLT (ELECTROSURGICAL)
ELECTRODE REM PT RTRN 9FT ADLT (ELECTROSURGICAL) ×1 IMPLANT
GAUZE SPONGE 4X4 12PLY STRL (GAUZE/BANDAGES/DRESSINGS) ×1 IMPLANT
GAUZE XEROFORM 1X8 LF (GAUZE/BANDAGES/DRESSINGS) ×1 IMPLANT
GLOVE BIO SURGEON STRL SZ7.5 (GLOVE) ×2 IMPLANT
GLOVE BIO SURGEON STRL SZ8 (GLOVE) ×2 IMPLANT
GLOVE BIOGEL PI IND STRL 8 (GLOVE) ×1 IMPLANT
GLOVE BIOGEL PI INDICATOR 8 (GLOVE)
GLOVE INDICATOR 8.0 STRL GRN (GLOVE) ×1 IMPLANT
GOWN STRL REUS W/ TWL LRG LVL3 (GOWN DISPOSABLE) ×1 IMPLANT
GOWN STRL REUS W/ TWL XL LVL3 (GOWN DISPOSABLE) ×1 IMPLANT
GOWN STRL REUS W/TWL LRG LVL3 (GOWN DISPOSABLE)
GOWN STRL REUS W/TWL XL LVL3 (GOWN DISPOSABLE)
GRASPER SUT 15 45D LOW PRO (SUTURE) IMPLANT
IV LACTATED RINGER IRRG 3000ML (IV SOLUTION)
IV LR IRRIG 3000ML ARTHROMATIC (IV SOLUTION) ×1 IMPLANT
KIT TURNOVER KIT A (KITS) ×1 IMPLANT
MANIFOLD NEPTUNE II (INSTRUMENTS) ×1 IMPLANT
MASK FACE SPIDER DISP (MASK) ×1 IMPLANT
MAT ABSORB  FLUID 56X50 GRAY (MISCELLANEOUS)
MAT ABSORB FLUID 56X50 GRAY (MISCELLANEOUS) ×1 IMPLANT
NDL HYPO 21X1.5 SAFETY (NEEDLE) ×1 IMPLANT
NEEDLE HYPO 21X1.5 SAFETY (NEEDLE) IMPLANT
NEEDLE HYPO 22GX1.5 SAFETY (NEEDLE) ×3 IMPLANT
PACK ARTHROSCOPY SHOULDER (MISCELLANEOUS) ×1 IMPLANT
PAD ALCOHOL SWAB (MISCELLANEOUS) ×4 IMPLANT
SLING ARM LRG DEEP (SOFTGOODS) ×1 IMPLANT
SLING ARM M TX990204 (SOFTGOODS) ×3 IMPLANT
SLING ULTRA II LG (MISCELLANEOUS) ×1 IMPLANT
STAPLER SKIN PROX 35W (STAPLE) ×1 IMPLANT
STRAP SAFETY 5IN WIDE (MISCELLANEOUS) ×1 IMPLANT
SUT ETHIBOND 0 MO6 C/R (SUTURE) ×1 IMPLANT
SUT VIC AB 2-0 CT1 27 (SUTURE)
SUT VIC AB 2-0 CT1 TAPERPNT 27 (SUTURE) ×2 IMPLANT
SYR 10ML LL (SYRINGE) ×1 IMPLANT
TAPE MICROFOAM 4IN (TAPE) ×1 IMPLANT
TUBING ARTHRO INFLOW-ONLY STRL (TUBING) ×1 IMPLANT
WAND WEREWOLF FLOW 90D (MISCELLANEOUS) ×1 IMPLANT

## 2019-03-22 NOTE — Op Note (Signed)
03/22/2019  10:40 AM  Patient:   Joy Alexander  Pre-Op Diagnosis:   Primary adhesive capsulitis, left shoulder.  Post-Op Diagnosis:   Same  Procedure:   Manipulation under anesthesia with steroid injection, left shoulder.  Surgeon:   Pascal Lux, MD  Assistant:   None  Anesthesia:   IV sedation with interscalene block placed preoperatively by anesthesiologist.  Findings:   As above. Prior to manipulation, the left shoulder could be forward flexed to 150 and abducted to 135. At 90 of abduction, the shoulder could be externally rotated to 70 and internally rotated to 40. Following manipulation, the shoulder could be forward flexed to 170, abducted to 170 and, at 90 of abduction, externally rotated to 90 and internally rotated to 65.  Complications:   None  EBL:   0 cc  Fluids:   200 cc crystalloid  TT:   None  Drains:   None  Closure:   None  Brief Clinical Note:   The patient is a 73 year old female with a history of left shoulder pain and limited motion. Despite extensive physical therapy, the patient continues to have difficulty regaining shoulder range of motion. The patient's history and examination are consistent with primary adhesive capsulitis. The patient presents at this time for a manipulation under anesthesia with steroid injection of the left shoulder.  Procedure:   The patient underwent placement of an interscalene block in the preoperative holding area before being brought into the operating room and lain in the supine position. After adequate IV sedation was achieved, a timeout was performed to verify the correct surgical site. The left shoulder was gently manipulated in both abduction and external rotation, as well as adduction and internal rotation. Several palpable and audible pops were heard as the scar tissue released, permitting full range of motion of the shoulder. The glenohumeral joint was injected sterilely using 1 cc of Kenalog-40 and 9  cc of 0.25% Sensorcaine with epinephrine before the patient was placed into a sling. The patient was then awakened and returned to the recovery room in satisfactory condition after tolerating the procedure well.

## 2019-03-22 NOTE — Anesthesia Post-op Follow-up Note (Signed)
Anesthesia QCDR form completed.        

## 2019-03-22 NOTE — Transfer of Care (Signed)
Immediate Anesthesia Transfer of Care Note  Patient: Joy Alexander  Procedure(s) Performed: CLOSED MANIPULATION SHOULDER WITH STEROID INJECTION (Left Shoulder) ARTHROSCOPY SHOULDER WITH DEBRIDEMENT, DECOMPRESSION, AND CAPSULAR RELEASE (Left Shoulder)  Patient Location: PACU  Anesthesia Type:MAC combined with regional for post-op pain  Level of Consciousness: awake, alert  and oriented  Airway & Oxygen Therapy: Patient Spontanous Breathing  Post-op Assessment: Report given to RN and Post -op Vital signs reviewed and stable  Post vital signs: Reviewed and stable  Last Vitals:  Vitals Value Taken Time  BP    Temp    Pulse 89 03/22/19 1040  Resp 18 03/22/19 1040  SpO2 97 % 03/22/19 1040  Vitals shown include unvalidated device data.  Last Pain:  Vitals:   03/22/19 0902  TempSrc: Tympanic  PainSc: 0-No pain         Complications: No apparent anesthesia complications

## 2019-03-22 NOTE — Anesthesia Preprocedure Evaluation (Signed)
Anesthesia Evaluation  Patient identified by MRN, date of birth, ID band Patient awake    Reviewed: Allergy & Precautions, NPO status , Patient's Chart, lab work & pertinent test results  History of Anesthesia Complications (+) PONV, Family history of anesthesia reaction and history of anesthetic complications  Airway Mallampati: II       Dental   Pulmonary neg sleep apnea, neg COPD,           Cardiovascular (-) hypertension+ Peripheral Vascular Disease and + PND  (-) Past MI and (-) CHF (-) Valvular Problems/Murmurs     Neuro/Psych neg Seizures Anxiety Depression    GI/Hepatic Neg liver ROS, neg GERD  ,  Endo/Other  diabetes, Type 2, Oral Hypoglycemic Agents  Renal/GU      Musculoskeletal   Abdominal   Peds  Hematology   Anesthesia Other Findings   Reproductive/Obstetrics                             Anesthesia Physical Anesthesia Plan  ASA: II  Anesthesia Plan: General   Post-op Pain Management: GA combined w/ Regional for post-op pain   Induction: Intravenous  PONV Risk Score and Plan:   Airway Management Planned: Oral ETT  Additional Equipment:   Intra-op Plan:   Post-operative Plan:   Informed Consent: I have reviewed the patients History and Physical, chart, labs and discussed the procedure including the risks, benefits and alternatives for the proposed anesthesia with the patient or authorized representative who has indicated his/her understanding and acceptance.       Plan Discussed with:   Anesthesia Plan Comments:         Anesthesia Quick Evaluation

## 2019-03-22 NOTE — H&P (Signed)
Paper H&P to be scanned into permanent record. H&P reviewed and patient re-examined. No changes. 

## 2019-03-22 NOTE — Anesthesia Procedure Notes (Signed)
Anesthesia Regional Block: Interscalene brachial plexus block   Pre-Anesthetic Checklist: ,, timeout performed, Correct Patient, Correct Site, Correct Laterality, Correct Procedure, Correct Position, site marked, Risks and benefits discussed,  Surgical consent,  Pre-op evaluation,  At surgeon's request and post-op pain management  Laterality: Left  Prep: alcohol swabs       Needles:  Injection technique: Single-shot  Needle Type: Stimiplex     Needle Length: 5cm  Needle Gauge: 22     Additional Needles:   Procedures:, nerve stimulator,,, ultrasound used (permanent image in chart),,,,   Nerve Stimulator or Paresthesia:  Response: biceps flexion, 0.8 mA,   Additional Responses:   Narrative:  Start time: 03/22/2019 9:57 AM End time: 03/22/2019 10:09 AM Injection made incrementally with aspirations every 5 mL.  Performed by: Personally   Additional Notes: Functioning IV was confirmed and monitors were applied.  A 74mm 22ga Stimuplex needle was used. Sterile prep and drape,hand hygiene and sterile gloves were used.  Negative aspiration and negative test dose prior to incremental administration of local anesthetic. The patient tolerated the procedure well.

## 2019-03-22 NOTE — Discharge Instructions (Addendum)
Orthopedic discharge instructions: May shower immediately.  Apply ice frequently to shoulder. Take ibuprofen 600 mg TID with meals for 7-10 days, then as necessary. May supplement with ES Tylenol if necessary. Remove sling once nerve block wears off and try to use arm as much as possible.   Begin physical therapy as scheduled tomorrow. Follow-up in 10-14 days or as scheduled.  AMBULATORY SURGERY  DISCHARGE INSTRUCTIONS   1) The drugs that you were given will stay in your system until tomorrow so for the next 24 hours you should not:  A) Drive an automobile B) Make any legal decisions C) Drink any alcoholic beverage   2) You may resume regular meals tomorrow.  Today it is better to start with liquids and gradually work up to solid foods.  You may eat anything you prefer, but it is better to start with liquids, then soup and crackers, and gradually work up to solid foods.   3) Please notify your doctor immediately if you have any unusual bleeding, trouble breathing, redness and pain at the surgery site, drainage, fever, or pain not relieved by medication.    4) Additional Instructions:        Please contact your physician with any problems or Same Day Surgery at [phone], Monday through Friday 6 am to 4 pm, or [place] at [facility] number at [phone].

## 2019-03-23 NOTE — Anesthesia Postprocedure Evaluation (Signed)
Anesthesia Post Note  Patient: Joy Alexander  Procedure(s) Performed: CLOSED MANIPULATION SHOULDER WITH STEROID INJECTION (Left Shoulder)  Patient location during evaluation: PACU Anesthesia Type: General Level of consciousness: awake and alert Pain management: pain level controlled Vital Signs Assessment: post-procedure vital signs reviewed and stable Respiratory status: spontaneous breathing and respiratory function stable Cardiovascular status: stable Anesthetic complications: no     Last Vitals:  Vitals:   03/22/19 1211 03/22/19 1245  BP: 125/61 118/64  Pulse: 79 76  Resp: 18 18  Temp: (!) 36.3 C   SpO2: 97% 97%    Last Pain:  Vitals:   03/23/19 1036  TempSrc:   PainSc: 0-No pain                 KEPHART,WILLIAM K

## 2019-04-06 ENCOUNTER — Other Ambulatory Visit: Payer: Self-pay | Admitting: Internal Medicine

## 2019-04-06 DIAGNOSIS — Z1231 Encounter for screening mammogram for malignant neoplasm of breast: Secondary | ICD-10-CM

## 2019-05-17 ENCOUNTER — Ambulatory Visit
Admission: RE | Admit: 2019-05-17 | Discharge: 2019-05-17 | Disposition: A | Discharge status: Home / Self Care | Payer: Medicare Other | Source: Ambulatory Visit | Attending: Internal Medicine | Admitting: Internal Medicine

## 2019-05-17 DIAGNOSIS — Z1231 Encounter for screening mammogram for malignant neoplasm of breast: Secondary | ICD-10-CM | POA: Insufficient documentation

## 2019-05-22 ENCOUNTER — Other Ambulatory Visit: Payer: Self-pay

## 2019-05-22 ENCOUNTER — Ambulatory Visit
Admission: RE | Admit: 2019-05-22 | Discharge: 2019-05-22 | Disposition: A | Discharge status: Home / Self Care | Payer: Medicare Other | Source: Ambulatory Visit | Attending: Family Medicine | Admitting: Family Medicine

## 2019-05-22 ENCOUNTER — Ambulatory Visit: Admission: RE | Admit: 2019-05-22 | Payer: Medicare Other | Source: Ambulatory Visit

## 2019-05-22 ENCOUNTER — Other Ambulatory Visit: Payer: Self-pay | Admitting: Family Medicine

## 2019-05-22 DIAGNOSIS — R1909 Other intra-abdominal and pelvic swelling, mass and lump: Secondary | ICD-10-CM

## 2019-05-22 MED ORDER — IOHEXOL 300 MG/ML  SOLN
100.0000 mL | Freq: Once | INTRAMUSCULAR | Status: AC | PRN
  Administered 2019-05-22: 13:00:00 100 mL via INTRAVENOUS

## 2019-06-22 ENCOUNTER — Other Ambulatory Visit
Admission: RE | Admit: 2019-06-22 | Discharge: 2019-06-22 | Disposition: A | Discharge status: Home / Self Care | Payer: Medicare Other | Source: Ambulatory Visit | Attending: Internal Medicine | Admitting: Internal Medicine

## 2019-06-22 DIAGNOSIS — Z20828 Contact with and (suspected) exposure to other viral communicable diseases: Secondary | ICD-10-CM | POA: Diagnosis not present

## 2019-06-22 DIAGNOSIS — Z01812 Encounter for preprocedural laboratory examination: Secondary | ICD-10-CM | POA: Insufficient documentation

## 2019-06-22 LAB — SARS CORONAVIRUS 2 (TAT 6-24 HRS): SARS Coronavirus 2: NEGATIVE

## 2019-06-27 ENCOUNTER — Encounter
Admission: RE | Disposition: A | Discharge status: Home / Self Care | Payer: Self-pay | Source: Home / Self Care | Attending: Internal Medicine

## 2019-06-27 ENCOUNTER — Ambulatory Visit
Admission: RE | Admit: 2019-06-27 | Discharge: 2019-06-27 | Disposition: A | Discharge status: Home / Self Care | Payer: Medicare Other | Attending: Internal Medicine | Admitting: Internal Medicine

## 2019-06-27 ENCOUNTER — Ambulatory Visit: Payer: Medicare Other | Admitting: Certified Registered Nurse Anesthetist

## 2019-06-27 ENCOUNTER — Encounter: Payer: Self-pay | Admitting: Certified Registered Nurse Anesthetist

## 2019-06-27 ENCOUNTER — Other Ambulatory Visit: Payer: Self-pay

## 2019-06-27 DIAGNOSIS — Z1211 Encounter for screening for malignant neoplasm of colon: Secondary | ICD-10-CM | POA: Insufficient documentation

## 2019-06-27 DIAGNOSIS — G114 Hereditary spastic paraplegia: Secondary | ICD-10-CM | POA: Insufficient documentation

## 2019-06-27 DIAGNOSIS — F329 Major depressive disorder, single episode, unspecified: Secondary | ICD-10-CM | POA: Diagnosis not present

## 2019-06-27 DIAGNOSIS — E785 Hyperlipidemia, unspecified: Secondary | ICD-10-CM | POA: Insufficient documentation

## 2019-06-27 DIAGNOSIS — Z888 Allergy status to other drugs, medicaments and biological substances status: Secondary | ICD-10-CM | POA: Insufficient documentation

## 2019-06-27 DIAGNOSIS — M8580 Other specified disorders of bone density and structure, unspecified site: Secondary | ICD-10-CM | POA: Insufficient documentation

## 2019-06-27 DIAGNOSIS — Z885 Allergy status to narcotic agent status: Secondary | ICD-10-CM | POA: Insufficient documentation

## 2019-06-27 DIAGNOSIS — E1151 Type 2 diabetes mellitus with diabetic peripheral angiopathy without gangrene: Secondary | ICD-10-CM | POA: Diagnosis not present

## 2019-06-27 DIAGNOSIS — Z79899 Other long term (current) drug therapy: Secondary | ICD-10-CM | POA: Insufficient documentation

## 2019-06-27 DIAGNOSIS — M1990 Unspecified osteoarthritis, unspecified site: Secondary | ICD-10-CM | POA: Insufficient documentation

## 2019-06-27 DIAGNOSIS — Z86010 Personal history of colonic polyps: Secondary | ICD-10-CM | POA: Insufficient documentation

## 2019-06-27 DIAGNOSIS — Z7982 Long term (current) use of aspirin: Secondary | ICD-10-CM | POA: Insufficient documentation

## 2019-06-27 DIAGNOSIS — F419 Anxiety disorder, unspecified: Secondary | ICD-10-CM | POA: Diagnosis not present

## 2019-06-27 HISTORY — PX: COLONOSCOPY WITH PROPOFOL: SHX5780

## 2019-06-27 LAB — GLUCOSE, CAPILLARY: Glucose-Capillary: 145 mg/dL — ABNORMAL HIGH (ref 70–99)

## 2019-06-27 SURGERY — COLONOSCOPY WITH PROPOFOL
Anesthesia: General

## 2019-06-27 MED ORDER — PROPOFOL 500 MG/50ML IV EMUL
INTRAVENOUS | Status: DC | PRN
  Administered 2019-06-27: 130 ug/kg/min via INTRAVENOUS

## 2019-06-27 MED ORDER — PROPOFOL 500 MG/50ML IV EMUL
INTRAVENOUS | Status: AC
  Filled 2019-06-27: qty 50

## 2019-06-27 MED ORDER — LIDOCAINE HCL (PF) 2 % IJ SOLN
INTRAMUSCULAR | Status: AC
  Filled 2019-06-27: qty 10

## 2019-06-27 MED ORDER — SODIUM CHLORIDE 0.9 % IV SOLN
INTRAVENOUS | Status: DC
  Administered 2019-06-27: 1000 mL via INTRAVENOUS

## 2019-06-27 MED ORDER — LIDOCAINE HCL (CARDIAC) PF 100 MG/5ML IV SOSY
PREFILLED_SYRINGE | INTRAVENOUS | Status: DC | PRN
  Administered 2019-06-27: 50 mg via INTRAVENOUS

## 2019-06-27 MED ORDER — PROPOFOL 10 MG/ML IV BOLUS
INTRAVENOUS | Status: DC | PRN
  Administered 2019-06-27: 70 mg via INTRAVENOUS

## 2019-06-27 NOTE — Anesthesia Preprocedure Evaluation (Signed)
Anesthesia Evaluation  Patient identified by MRN, date of birth, ID band Patient awake    Reviewed: Allergy & Precautions, NPO status , Patient's Chart, lab work & pertinent test results  History of Anesthesia Complications (+) PONV and history of anesthetic complications  Airway Mallampati: II       Dental   Pulmonary neg sleep apnea, neg COPD, Not current smoker,           Cardiovascular (-) hypertension(-) Past MI and (-) CHF (-) dysrhythmias (-) Valvular Problems/Murmurs     Neuro/Psych neg Seizures Anxiety Depression    GI/Hepatic Neg liver ROS, neg GERD  ,  Endo/Other  diabetes, Type 2, Oral Hypoglycemic Agents  Renal/GU negative Renal ROS     Musculoskeletal   Abdominal   Peds  Hematology   Anesthesia Other Findings   Reproductive/Obstetrics                             Anesthesia Physical Anesthesia Plan  ASA: II  Anesthesia Plan: General   Post-op Pain Management:    Induction: Intravenous  PONV Risk Score and Plan: 4 or greater and Propofol infusion, Ondansetron, TIVA and Treatment may vary due to age or medical condition  Airway Management Planned: Nasal Cannula  Additional Equipment:   Intra-op Plan:   Post-operative Plan:   Informed Consent: I have reviewed the patients History and Physical, chart, labs and discussed the procedure including the risks, benefits and alternatives for the proposed anesthesia with the patient or authorized representative who has indicated his/her understanding and acceptance.       Plan Discussed with:   Anesthesia Plan Comments:         Anesthesia Quick Evaluation

## 2019-06-27 NOTE — Anesthesia Post-op Follow-up Note (Signed)
Anesthesia QCDR form completed.        

## 2019-06-27 NOTE — Interval H&P Note (Signed)
History and Physical Interval Note:  06/27/2019 10:26 AM  Joy Alexander  has presented today for surgery, with the diagnosis of PHCP.  The various methods of treatment have been discussed with the patient and family. After consideration of risks, benefits and other options for treatment, the patient has consented to  Procedure(s): COLONOSCOPY WITH PROPOFOL (N/A) as a surgical intervention.  The patient's history has been reviewed, patient examined, no change in status, stable for surgery.  I have reviewed the patient's chart and labs.  Questions were answered to the patient's satisfaction.     Fernando Salinas, [place]

## 2019-06-27 NOTE — Transfer of Care (Signed)
Immediate Anesthesia Transfer of Care Note  Patient: Joy Alexander  Procedure(s) Performed: COLONOSCOPY WITH PROPOFOL (N/A )  Patient Location: PACU and Endoscopy Unit  Anesthesia Type:General  Level of Consciousness: drowsy  Airway & Oxygen Therapy: Patient Spontanous Breathing  Post-op Assessment: Report given to RN and Post -op Vital signs reviewed and stable  Post vital signs: Reviewed and stable  Last Vitals:  Vitals Value Taken Time  BP 85/41 06/27/19 1118  Temp 35.8 C 06/27/19 1117  Pulse 75 06/27/19 1118  Resp 17 06/27/19 1118  SpO2 98 % 06/27/19 1118  Vitals shown include unvalidated device data.  Last Pain:  Vitals:   06/27/19 1117  TempSrc: Tympanic  PainSc:          Complications: No apparent anesthesia complications

## 2019-06-27 NOTE — H&P (Signed)
Outpatient short stay form Pre-procedure 06/27/2019 10:25 AM Joy Alexander K. Norma Fredrickson, M.D.  Primary Physician: Einar Crow, M.D.  Reason for visit:  Personal hx of colon polyps.  History of present illness:                            Patient presents for colonoscopy for a personal hx of colon polyps. The patient denies abdominal pain, abnormal weight loss or rectal bleeding.     Current Facility-Administered Medications:  .  0.9 %  sodium chloride infusion, , Intravenous, Continuous, [place], Boykin Nearing, MD, Last Rate: 20 mL/hr at 06/27/19 1018, 1,000 mL at 06/27/19 1018  Medications Prior to Admission  Medication Sig Dispense Refill Last Dose  . acetaminophen (TYLENOL 8 HOUR) 650 MG CR tablet Take 650 mg by mouth 2 (two) times a day.   Past Week at Unknown time  . aspirin EC 81 MG tablet Take 81 mg by mouth at bedtime.    Past Week at Unknown time  . carboxymethylcellulose (REFRESH PLUS) 0.5 % SOLN Place 1 drop into both eyes 3 (three) times daily as needed (dry/irritated eyes.).   Past Week at Unknown time  . Cholecalciferol (VITAMIN D3) 50 MCG (2000 UT) TABS Take 2,000 Units by mouth daily.   Past Week at Unknown time  . escitalopram (LEXAPRO) 20 MG tablet Take 20 mg by mouth daily.   Past Week at Unknown time  . gabapentin (NEURONTIN) 300 MG capsule Take 600 mg by mouth at bedtime.    06/26/2019 at Unknown time  . ibandronate (BONIVA) 150 MG tablet Take 150 mg by mouth every 30 (thirty) days. Take in the morning with a full glass of water, on an empty stomach, and do not take anything else by mouth or lie down for the next 30 min.   Past Week at Unknown time  . ibuprofen (ADVIL) 200 MG tablet Take 200 mg by mouth every 8 (eight) hours as needed (pain).   Past Month at Unknown time  . primidone (MYSOLINE) 50 MG tablet Take 100 mg by mouth 3 (three) times daily.    06/26/2019 at Unknown time  . simvastatin (ZOCOR) 10 MG tablet Take 10 mg by mouth at bedtime.    06/26/2019 at Unknown time      Allergies  Allergen Reactions  . Midazolam Nausea And Vomiting  . Mobic [Meloxicam] Nausea And Vomiting  . Tramadol Nausea And Vomiting     Past Medical History:  Diagnosis Date  . Anxiety   . Arthritis   . Complication of anesthesia   . Depression   . Diabetes mellitus without complication (HCC)   . Family history of adverse reaction to anesthesia    sister PONV  . Hereditary spastic paraparesis (HCC)   . Hyperlipemia   . Osteopenia   . Peripheral vascular disease (HCC)    carotid artery stenosis  . PONV (postoperative nausea and vomiting)   . Tremors of nervous system     Review of systems:  Otherwise negative.    Physical Exam  Gen: Alert, oriented. Appears stated age.  HEENT: [place]/AT. PERRLA. Lungs: CTA, no wheezes. CV: RR nl S1, S2. Abd: soft, benign, no masses. BS+ Ext: No edema. Pulses 2+    Planned procedures: Proceed with colonoscopy. The patient understands the nature of the planned procedure, indications, risks, alternatives and potential complications including but not limited to bleeding, infection, perforation, damage to internal organs and possible oversedation/side effects from anesthesia.  The patient agrees and gives consent to proceed.  Please refer to procedure notes for findings, recommendations and patient disposition/instructions.     Cortlan Dolin K. Alice Reichert, M.D. Gastroenterology 06/27/2019  10:25 AM

## 2019-06-27 NOTE — Interval H&P Note (Signed)
History and Physical Interval Note:  06/27/2019 10:49 AM  Joy Alexander  has presented today for surgery, with the diagnosis of PHCP.  The various methods of treatment have been discussed with the patient and family. After consideration of risks, benefits and other options for treatment, the patient has consented to  Procedure(s): COLONOSCOPY WITH PROPOFOL (N/A) as a surgical intervention.  The patient's history has been reviewed, patient examined, no change in status, stable for surgery.  I have reviewed the patient's chart and labs.  Questions were answered to the patient's satisfaction.     [place], [place]

## 2019-06-27 NOTE — Op Note (Signed)
[facility] Gastroenterology Patient Name: Joy BruceKatherine Durkin Procedure Date: 06/27/2019 10:51 AM MRN: [id number] Account #: [id number] Date of Birth: [birth year] Admit Type: Outpatient Age: [age] [facility] END[facility] ROOM 3 Gender: Female Note Status: Finalized Procedure:            Colonoscopy Indications:          High risk colon cancer surveillance: Personal history                        of colonic polyps Providers:            Boykin Nearingeodoro K. Sol Odor MD, MD Medicines:            Propofol per Anesthesia Complications:        No immediate complications. Procedure:            Pre-Anesthesia Assessment:                       - The risks and benefits of the procedure and the                        sedation options and risks were discussed with the                        patient. All questions were answered and informed                        consent was obtained.                       - Patient identification and proposed procedure were                        verified prior to the procedure by the nurse. The                        procedure was verified in the procedure room.                       - ASA Grade Assessment: III - A patient with severe                        systemic disease.                       - After reviewing the risks and benefits, the patient                        was deemed in satisfactory condition to undergo the                        procedure.                       After obtaining informed consent, the colonoscope was                        passed under direct vision. Throughout the procedure,                        the patient's blood pressure, pulse, and oxygen  saturations were monitored continuously. The                        Colonoscope was introduced through the anus and                        advanced to the the cecum, identified by appendiceal                        orifice and ileocecal valve. The colonoscopy was                      performed without difficulty. The patient tolerated the                        procedure well. The quality of the bowel preparation                        was excellent. The ileocecal valve, appendiceal                        orifice, and rectum were photographed. Findings:      The perianal and digital rectal examinations were normal. Pertinent       negatives include normal sphincter tone and no palpable rectal lesions.      The entire examined colon appeared normal on direct and retroflexion       views. Impression:           - The entire examined colon is normal on direct and                        retroflexion views.                       - No specimens collected. Recommendation:       - Patient has a contact number available for                        emergencies. The signs and symptoms of potential                        delayed complications were discussed with the patient.                        Return to normal activities tomorrow. Written discharge                        instructions were provided to the patient.                       - Resume previous diet.                       - Continue present medications.                       - No repeat colonoscopy due to current age (73 years or                        older) and the absence of advanced adenomas.                       -  Return to GI office PRN.                       - The findings and recommendations were discussed with                        the patient. Procedure Code(s):    --- Professional ---                       B3532, Colorectal cancer screening; colonoscopy on                        individual at high risk Diagnosis Code(s):    --- Professional ---                       Z86.010, Personal history of colonic polyps CPT copyright 2019 American Medical Association. All rights reserved. The codes documented in this report are preliminary and upon coder review may  be revised to meet current  compliance requirements. Stanton Kidney MD, MD 06/27/2019 11:17:55 AM This report has been signed electronically. Number of Addenda: 0 Note Initiated On: 06/27/2019 10:51 AM Scope Withdrawal Time: 0 hours 7 minutes 52 seconds  Total Procedure Duration: 0 hours 13 minutes 46 seconds  Estimated Blood Loss: Estimated blood loss: none.      [facility]

## 2019-06-28 ENCOUNTER — Encounter: Payer: Self-pay | Admitting: Internal Medicine

## 2019-06-28 NOTE — Anesthesia Postprocedure Evaluation (Signed)
Anesthesia Post Note  Patient: Joy Alexander  Procedure(s) Performed: COLONOSCOPY WITH PROPOFOL (N/A )  Patient location during evaluation: Endoscopy Anesthesia Type: General Level of consciousness: awake and alert Pain management: pain level controlled Vital Signs Assessment: post-procedure vital signs reviewed and stable Respiratory status: spontaneous breathing and respiratory function stable Cardiovascular status: stable Anesthetic complications: no     Last Vitals:  Vitals:   06/27/19 1137 06/27/19 1147  BP: (!) 126/58 (!) 161/79  Pulse: 84   Resp: 18   Temp:    SpO2:      Last Pain:  Vitals:   06/28/19 0747  TempSrc:   PainSc: 0-No pain                 Shandon Burlingame K

## 2019-08-20 NOTE — Anesthesia Postprocedure Evaluation (Signed)
Anesthesia Post Note  Patient: Joy Alexander  Procedure(s) Performed: CLOSED MANIPULATION SHOULDER WITH STEROID INJECTION (Left Shoulder)  Patient location during evaluation: PACU Anesthesia Type: General Level of consciousness: awake and alert Pain management: pain level controlled Vital Signs Assessment: post-procedure vital signs reviewed and stable Respiratory status: spontaneous breathing and respiratory function stable Cardiovascular status: stable Anesthetic complications: no     Last Vitals:  Vitals:   03/22/19 1211 03/22/19 1245  BP: 125/61 118/64  Pulse: 79 76  Resp: 18 18  Temp: (!) 36.3 C   SpO2: 97% 97%    Last Pain:  Vitals:   03/23/19 1036  TempSrc:   PainSc: 0-No pain                 KEPHART,WILLIAM K

## 2020-04-08 ENCOUNTER — Other Ambulatory Visit: Payer: Self-pay | Admitting: Internal Medicine

## 2020-04-08 DIAGNOSIS — Z1231 Encounter for screening mammogram for malignant neoplasm of breast: Secondary | ICD-10-CM

## 2020-05-22 ENCOUNTER — Other Ambulatory Visit: Payer: Self-pay

## 2020-05-22 ENCOUNTER — Ambulatory Visit
Admission: RE | Admit: 2020-05-22 | Discharge: 2020-05-22 | Disposition: A | Discharge status: Home / Self Care | Payer: Medicare Other | Source: Ambulatory Visit | Attending: Internal Medicine | Admitting: Internal Medicine

## 2020-05-22 DIAGNOSIS — Z1231 Encounter for screening mammogram for malignant neoplasm of breast: Secondary | ICD-10-CM | POA: Diagnosis present

## 2020-07-02 ENCOUNTER — Other Ambulatory Visit: Payer: Self-pay | Admitting: Neurology

## 2020-07-02 ENCOUNTER — Other Ambulatory Visit (HOSPITAL_COMMUNITY): Payer: Self-pay | Admitting: Neurology

## 2020-07-02 DIAGNOSIS — R471 Dysarthria and anarthria: Secondary | ICD-10-CM

## 2020-07-28 ENCOUNTER — Other Ambulatory Visit: Payer: Self-pay

## 2020-07-28 ENCOUNTER — Ambulatory Visit
Admission: RE | Admit: 2020-07-28 | Discharge: 2020-07-28 | Disposition: A | Discharge status: Home / Self Care | Payer: Medicare Other | Source: Ambulatory Visit | Attending: Neurology | Admitting: Neurology

## 2020-07-28 DIAGNOSIS — R471 Dysarthria and anarthria: Secondary | ICD-10-CM | POA: Insufficient documentation

## 2020-07-28 MED ORDER — GADOBUTROL 1 MMOL/ML IV SOLN
5.0000 mL | Freq: Once | INTRAVENOUS | Status: AC | PRN
  Administered 2020-07-28: 5 mL via INTRAVENOUS

## 2020-07-31 ENCOUNTER — Ambulatory Visit: Payer: Medicare Other | Admitting: Dermatology

## 2020-11-17 ENCOUNTER — Ambulatory Visit: Payer: Medicare Other | Admitting: Dermatology

## 2021-05-08 ENCOUNTER — Other Ambulatory Visit: Payer: Self-pay | Admitting: Internal Medicine

## 2021-05-08 DIAGNOSIS — Z1231 Encounter for screening mammogram for malignant neoplasm of breast: Secondary | ICD-10-CM

## 2021-05-25 ENCOUNTER — Ambulatory Visit
Admission: RE | Admit: 2021-05-25 | Discharge: 2021-05-25 | Disposition: A | Discharge status: Home / Self Care | Payer: Medicare Other | Source: Ambulatory Visit | Attending: Internal Medicine | Admitting: Internal Medicine

## 2021-05-25 ENCOUNTER — Other Ambulatory Visit: Payer: Self-pay

## 2021-05-25 DIAGNOSIS — Z1231 Encounter for screening mammogram for malignant neoplasm of breast: Secondary | ICD-10-CM | POA: Insufficient documentation

## 2021-08-06 ENCOUNTER — Inpatient Hospital Stay: Payer: Medicare Other

## 2021-08-06 ENCOUNTER — Emergency Department: Payer: Medicare Other

## 2021-08-06 ENCOUNTER — Inpatient Hospital Stay
Admission: EM | Admit: 2021-08-06 | Discharge: 2021-08-10 | DRG: 522 | Disposition: A | Discharge status: Skilled Nursing Facility | Payer: Medicare Other | Attending: Student in an Organized Health Care Education/Training Program | Admitting: Student in an Organized Health Care Education/Training Program

## 2021-08-06 ENCOUNTER — Other Ambulatory Visit: Payer: Self-pay

## 2021-08-06 DIAGNOSIS — Z9889 Other specified postprocedural states: Secondary | ICD-10-CM

## 2021-08-06 DIAGNOSIS — E785 Hyperlipidemia, unspecified: Secondary | ICD-10-CM | POA: Diagnosis present

## 2021-08-06 DIAGNOSIS — E1151 Type 2 diabetes mellitus with diabetic peripheral angiopathy without gangrene: Secondary | ICD-10-CM | POA: Diagnosis present

## 2021-08-06 DIAGNOSIS — Z888 Allergy status to other drugs, medicaments and biological substances status: Secondary | ICD-10-CM | POA: Diagnosis not present

## 2021-08-06 DIAGNOSIS — Z7982 Long term (current) use of aspirin: Secondary | ICD-10-CM

## 2021-08-06 DIAGNOSIS — F32A Depression, unspecified: Secondary | ICD-10-CM | POA: Diagnosis present

## 2021-08-06 DIAGNOSIS — M8580 Other specified disorders of bone density and structure, unspecified site: Secondary | ICD-10-CM | POA: Diagnosis present

## 2021-08-06 DIAGNOSIS — S72011A Unspecified intracapsular fracture of right femur, initial encounter for closed fracture: Principal | ICD-10-CM | POA: Diagnosis present

## 2021-08-06 DIAGNOSIS — F3289 Other specified depressive episodes: Secondary | ICD-10-CM | POA: Diagnosis not present

## 2021-08-06 DIAGNOSIS — F419 Anxiety disorder, unspecified: Secondary | ICD-10-CM | POA: Diagnosis present

## 2021-08-06 DIAGNOSIS — Z79899 Other long term (current) drug therapy: Secondary | ICD-10-CM | POA: Diagnosis not present

## 2021-08-06 DIAGNOSIS — R296 Repeated falls: Secondary | ICD-10-CM | POA: Diagnosis present

## 2021-08-06 DIAGNOSIS — Z885 Allergy status to narcotic agent status: Secondary | ICD-10-CM | POA: Diagnosis not present

## 2021-08-06 DIAGNOSIS — G114 Hereditary spastic paraplegia: Secondary | ICD-10-CM | POA: Diagnosis present

## 2021-08-06 DIAGNOSIS — S72009A Fracture of unspecified part of neck of unspecified femur, initial encounter for closed fracture: Secondary | ICD-10-CM | POA: Diagnosis present

## 2021-08-06 DIAGNOSIS — Z20822 Contact with and (suspected) exposure to covid-19: Secondary | ICD-10-CM | POA: Diagnosis present

## 2021-08-06 DIAGNOSIS — S72001A Fracture of unspecified part of neck of right femur, initial encounter for closed fracture: Principal | ICD-10-CM

## 2021-08-06 DIAGNOSIS — T148XXA Other injury of unspecified body region, initial encounter: Secondary | ICD-10-CM

## 2021-08-06 DIAGNOSIS — W1830XA Fall on same level, unspecified, initial encounter: Secondary | ICD-10-CM | POA: Diagnosis present

## 2021-08-06 DIAGNOSIS — Y92009 Unspecified place in unspecified non-institutional (private) residence as the place of occurrence of the external cause: Secondary | ICD-10-CM | POA: Diagnosis not present

## 2021-08-06 DIAGNOSIS — W19XXXA Unspecified fall, initial encounter: Secondary | ICD-10-CM

## 2021-08-06 LAB — CBC WITH DIFFERENTIAL/PLATELET
Abs Immature Granulocytes: 0.06 10*3/uL (ref 0.00–0.07)
Basophils Absolute: 0.1 10*3/uL (ref 0.0–0.1)
Basophils Relative: 1 %
Eosinophils Absolute: 0 10*3/uL (ref 0.0–0.5)
Eosinophils Relative: 1 %
HCT: 36.8 % (ref 36.0–46.0)
Hemoglobin: 12.3 g/dL (ref 12.0–15.0)
Immature Granulocytes: 1 %
Lymphocytes Relative: 17 %
Lymphs Abs: 0.9 10*3/uL (ref 0.7–4.0)
MCH: 32.1 pg (ref 26.0–34.0)
MCHC: 33.4 g/dL (ref 30.0–36.0)
MCV: 96.1 fL (ref 80.0–100.0)
Monocytes Absolute: 0.5 10*3/uL (ref 0.1–1.0)
Monocytes Relative: 9 %
Neutro Abs: 3.7 10*3/uL (ref 1.7–7.7)
Neutrophils Relative %: 71 %
Platelets: 161 10*3/uL (ref 150–400)
RBC: 3.83 MIL/uL — ABNORMAL LOW (ref 3.87–5.11)
RDW: 12.6 % (ref 11.5–15.5)
WBC: 5.2 10*3/uL (ref 4.0–10.5)
nRBC: 0 % (ref 0.0–0.2)

## 2021-08-06 LAB — RESP PANEL BY RT-PCR (FLU A&B, COVID) ARPGX2
Influenza A by PCR: NEGATIVE
Influenza B by PCR: NEGATIVE
SARS Coronavirus 2 by RT PCR: NEGATIVE

## 2021-08-06 LAB — COMPREHENSIVE METABOLIC PANEL
ALT: 28 U/L (ref 0–44)
AST: 29 U/L (ref 15–41)
Albumin: 3.8 g/dL (ref 3.5–5.0)
Alkaline Phosphatase: 67 U/L (ref 38–126)
Anion gap: 6 (ref 5–15)
BUN: 22 mg/dL (ref 8–23)
CO2: 27 mmol/L (ref 22–32)
Calcium: 8.5 mg/dL — ABNORMAL LOW (ref 8.9–10.3)
Chloride: 104 mmol/L (ref 98–111)
Creatinine, Ser: 0.49 mg/dL (ref 0.44–1.00)
GFR, Estimated: >60 mL/min (ref >60)
Glucose, Bld: 176 mg/dL — ABNORMAL HIGH (ref 70–99)
Potassium: 3.9 mmol/L (ref 3.5–5.1)
Sodium: 137 mmol/L (ref 135–145)
Total Bilirubin: 0.5 mg/dL (ref 0.3–1.2)
Total Protein: 6.3 g/dL — ABNORMAL LOW (ref 6.5–8.1)

## 2021-08-06 LAB — TROPONIN I (HIGH SENSITIVITY)
Troponin I (High Sensitivity): 4 ng/L (ref <18)
Troponin I (High Sensitivity): 12 ng/L (ref <18)

## 2021-08-06 LAB — GLUCOSE, CAPILLARY: Glucose-Capillary: 199 mg/dL — ABNORMAL HIGH (ref 70–99)

## 2021-08-06 LAB — TYPE AND SCREEN
ABO/RH(D): O POS
Antibody Screen: NEGATIVE

## 2021-08-06 MED ORDER — GABAPENTIN 300 MG PO CAPS
600.0000 mg | ORAL_CAPSULE | Freq: Every day | ORAL | Status: DC
  Administered 2021-08-06 – 2021-08-09 (×4): 600 mg via ORAL
  Filled 2021-08-06 (×4): qty 2

## 2021-08-06 MED ORDER — GLUCERNA SHAKE PO LIQD
237.0000 mL | Freq: Three times a day (TID) | ORAL | Status: DC
  Administered 2021-08-08 – 2021-08-10 (×6): 237 mL via ORAL

## 2021-08-06 MED ORDER — ESCITALOPRAM OXALATE 20 MG PO TABS
20.0000 mg | ORAL_TABLET | Freq: Every day | ORAL | Status: DC
  Administered 2021-08-07 – 2021-08-10 (×3): 20 mg via ORAL
  Filled 2021-08-06 (×4): qty 1

## 2021-08-06 MED ORDER — METHOCARBAMOL 500 MG PO TABS
500.0000 mg | ORAL_TABLET | Freq: Four times a day (QID) | ORAL | Status: DC | PRN
  Filled 2021-08-06: qty 1

## 2021-08-06 MED ORDER — MORPHINE SULFATE (PF) 2 MG/ML IV SOLN
0.5000 mg | INTRAVENOUS | Status: DC | PRN
Start: 2021-08-06 — End: 2021-08-07

## 2021-08-06 MED ORDER — HYDROCODONE-ACETAMINOPHEN 5-325 MG PO TABS
1.0000 | ORAL_TABLET | Freq: Four times a day (QID) | ORAL | Status: DC | PRN
  Administered 2021-08-06 – 2021-08-08 (×5): 2 via ORAL
  Administered 2021-08-09: 1 via ORAL
  Administered 2021-08-09 (×2): 2 via ORAL
  Filled 2021-08-06: qty 1
  Filled 2021-08-06 (×7): qty 2

## 2021-08-06 MED ORDER — METHOCARBAMOL 1000 MG/10ML IJ SOLN
500.0000 mg | Freq: Four times a day (QID) | INTRAVENOUS | Status: DC | PRN
  Filled 2021-08-06: qty 5

## 2021-08-06 MED ORDER — VITAMIN D3 25 MCG (1000 UNIT) PO TABS
2000.0000 [IU] | ORAL_TABLET | Freq: Every day | ORAL | Status: DC
  Administered 2021-08-07 – 2021-08-10 (×3): 2000 [IU] via ORAL
  Filled 2021-08-06 (×8): qty 2

## 2021-08-06 MED ORDER — ONDANSETRON HCL 4 MG/2ML IJ SOLN
4.0000 mg | Freq: Four times a day (QID) | INTRAMUSCULAR | Status: DC | PRN
  Administered 2021-08-06 – 2021-08-10 (×6): 4 mg via INTRAVENOUS
  Filled 2021-08-06 (×5): qty 2

## 2021-08-06 MED ORDER — ACETAMINOPHEN 325 MG PO TABS
325.0000 mg | ORAL_TABLET | Freq: Four times a day (QID) | ORAL | Status: DC
  Administered 2021-08-06 – 2021-08-10 (×10): 325 mg via ORAL
  Filled 2021-08-06 (×10): qty 1

## 2021-08-06 MED ORDER — SIMVASTATIN 20 MG PO TABS
10.0000 mg | ORAL_TABLET | Freq: Every day | ORAL | Status: DC
  Administered 2021-08-06 – 2021-08-09 (×4): 10 mg via ORAL
  Filled 2021-08-06 (×4): qty 1

## 2021-08-06 MED ORDER — PRIMIDONE 250 MG PO TABS
250.0000 mg | ORAL_TABLET | Freq: Two times a day (BID) | ORAL | Status: DC
  Administered 2021-08-06 – 2021-08-10 (×7): 250 mg via ORAL
  Filled 2021-08-06 (×10): qty 1

## 2021-08-06 MED ORDER — ADULT MULTIVITAMIN W/MINERALS CH
1.0000 | ORAL_TABLET | Freq: Every day | ORAL | Status: DC
  Administered 2021-08-09 – 2021-08-10 (×2): 1 via ORAL
  Filled 2021-08-06 (×2): qty 1

## 2021-08-06 MED ORDER — CEFAZOLIN SODIUM-DEXTROSE 2-4 GM/100ML-% IV SOLN
2.0000 g | INTRAVENOUS | Status: AC
  Administered 2021-08-08: 2 g via INTRAVENOUS
  Filled 2021-08-06: qty 100

## 2021-08-06 MED ORDER — SENNOSIDES-DOCUSATE SODIUM 8.6-50 MG PO TABS
1.0000 | ORAL_TABLET | Freq: Every day | ORAL | Status: DC
  Administered 2021-08-06: 1 via ORAL
  Filled 2021-08-06: qty 1

## 2021-08-06 MED ORDER — PHENAZOPYRIDINE HCL 100 MG PO TABS
100.0000 mg | ORAL_TABLET | Freq: Once | ORAL | Status: AC
  Administered 2021-08-06: 100 mg via ORAL
  Filled 2021-08-06: qty 1

## 2021-08-06 MED ORDER — ACETAMINOPHEN ER 650 MG PO TBCR: 650.0000 mg | EXTENDED_RELEASE_TABLET | Freq: Two times a day (BID) | ORAL | Status: DC

## 2021-08-06 MED ORDER — ONDANSETRON HCL 4 MG/2ML IJ SOLN
4.0000 mg | Freq: Once | INTRAMUSCULAR | Status: AC
  Administered 2021-08-06: 4 mg via INTRAVENOUS
  Filled 2021-08-06: qty 2

## 2021-08-06 NOTE — H&P (Signed)
History and Physical    Joy Alexander AGT:[id number] DOB: [birth year] DOA: 08/06/2021  PCP: Lauro Regulus, MD   Patient coming from: [facility] independent living  I have personally briefly reviewed patient's old medical records in [facility] Link  Chief Complaint: Right hip pain  HPI: Joy Alexander is a 75 y.o. 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 Patient states that she got up from a sitting position and had a sudden onset (which is not unusual for her) resulting in a fall with her hitting her right hip on a piece of furniture.  She developed severe pain in her right hip following the fall and was unable to get up.  She notes that she was unable to straighten her right leg. She denies feeling dizzy or lightheaded prior to the fall and denied losing consciousness. She lives at [facility] and was able to hit her life alert and EMS responded and brought her to the ER. She denies having any chest pain, no shortness of breath, no nausea, no vomiting, no abdominal pain, no leg swelling, no changes in her bowel habits, no urinary symptoms, no cough, no headache, no blurred vision or focal deficit. No labs available at the time of this history and physical. Twelve-lead EKG is normal sinus rhythm with no acute ST-T wave changes  ED Course: Patient is a 75 year old female who presents to the ER for evaluation following a fall and is found to have an acute impacted right subcapital femoral neck fracture. She will be admitted to the hospital for further evaluation.   Review of Systems: As per HPI otherwise all other systems reviewed and negative.    Past Medical History:  Diagnosis Date   Anxiety    Arthritis    Complication of anesthesia    Depression    Diabetes mellitus without complication (HCC)    Family history of  adverse reaction to anesthesia    sister PONV   Hereditary spastic paraparesis (HCC)    Hyperlipemia    Osteopenia    Peripheral vascular disease (HCC)    carotid artery stenosis   PONV (postoperative nausea and vomiting)    Tremors of nervous system     Past Surgical History:  Procedure Laterality Date   APPENDECTOMY     CLOSED MANIPULATION SHOULDER WITH STERIOD INJECTION Left 03/22/2019   Procedure: CLOSED MANIPULATION SHOULDER WITH STEROID INJECTION;  Surgeon: Christena Flake, MD;  Location: [facility] ORS;  Service: Orthopedics;  Laterality: Left;   COLONOSCOPY     COLONOSCOPY WITH PROPOFOL N/A 06/27/2019   Procedure: COLONOSCOPY WITH PROPOFOL;  Surgeon: [place], Boykin Nearing, MD;  Location: [facility] ENDOSCOPY;  Service: Gastroenterology;  Laterality: N/A;   EYE SURGERY     FRACTURE SURGERY Right    fib tib ankle   fx tibia /fibia     TONSILLECTOMY       reports that she has never smoked. She has never used smokeless tobacco. She reports current alcohol use. She reports that she does not use drugs.  Allergies  Allergen Reactions   Midazolam Nausea And Vomiting   Mobic [Meloxicam] Nausea And Vomiting   Tramadol Nausea And Vomiting    Family History  Problem Relation Age of Onset   Breast cancer Neg Hx       Prior to Admission medications   Medication Sig Start Date End Date Taking?  Authorizing Provider  acetaminophen (TYLENOL) 650 MG CR tablet Take 650 mg by mouth 2 (two) times a day.   Yes [provider]  aspirin EC 81 MG tablet Take 81 mg by mouth at bedtime.    Yes [provider]  Cholecalciferol (VITAMIN D3) 50 MCG (2000 UT) TABS Take 2,000 Units by mouth daily.   Yes [provider]  escitalopram (LEXAPRO) 20 MG tablet Take 20 mg by mouth daily.   Yes [provider]  gabapentin (NEURONTIN) 300 MG capsule Take 600 mg by mouth at bedtime.    Yes [provider]  ibandronate (BONIVA) 150 MG tablet Take 150 mg by mouth every 30  (thirty) days. Take in the morning with a full glass of water, on an empty stomach, and do not take anything else by mouth or lie down for the next 30 min.   Yes [provider]  primidone (MYSOLINE) 250 MG tablet Take 250 mg by mouth 2 (two) times daily. 08/04/21  Yes [provider]  simvastatin (ZOCOR) 10 MG tablet Take 10 mg by mouth at bedtime.    Yes [provider]  carboxymethylcellulose (REFRESH PLUS) 0.5 % SOLN Place 1 drop into both eyes 3 (three) times daily as needed (dry/irritated eyes.).    [provider]  ibuprofen (ADVIL) 200 MG tablet Take 200 mg by mouth every 8 (eight) hours as needed (pain).    [provider]    Physical Exam: Vitals:   08/06/21 1308 08/06/21 1312 08/06/21 1330  BP:  137/66 135/65  Pulse:  77 71  Resp:  19 14  Temp:  98.9 F (37.2 C)   TempSrc:  Oral   SpO2:  97% 95%  Weight: 52.4 kg    Height: 5\' 3"  (1.6 m)       Vitals:   08/06/21 1308 08/06/21 1312 08/06/21 1330  BP:  137/66 135/65  Pulse:  77 71  Resp:  19 14  Temp:  98.9 F (37.2 C)   TempSrc:  Oral   SpO2:  97% 95%  Weight: 52.4 kg    Height: 5\' 3"  (1.6 m)        Constitutional: Alert and oriented x 3 . Not in any apparent distress HEENT:      Head: Normocephalic and atraumatic.         Eyes: PERLA, EOMI, Conjunctivae are normal. Sclera is non-icteric.       Mouth/Throat: Mucous membranes are moist.       Neck: Supple with no signs of meningismus. Cardiovascular: Regular rate and rhythm. No murmurs, gallops, or rubs. 2+ symmetrical distal pulses are present . No JVD. No LE edema Respiratory: Respiratory effort normal .Lungs sounds clear bilaterally. No wheezes, crackles, or rhonchi.  Gastrointestinal: Soft, non tender, and non distended with positive bowel sounds.  Genitourinary: No CVA tenderness. Musculoskeletal: Decreased range of motion right hip. Neurologic:  Face is symmetric. Moving all extremities. No gross focal  neurologic deficits . Skin: Skin is warm, dry.  No rash or ulcers Psychiatric: Mood and affect are normal    Labs on Admission: I have personally reviewed following labs and imaging studies  CBC: No results for input(s): WBC, NEUTROABS, HGB, HCT, MCV, PLT in the last 168 hours. Basic Metabolic Panel: No results for input(s): NA, K, CL, CO2, GLUCOSE, BUN, CREATININE, CALCIUM, MG, PHOS in the last 168 hours. GFR: CrCl cannot be calculated (No successful lab value found.). Liver Function Tests: No results for input(s): AST, ALT,  ALKPHOS, BILITOT, PROT, ALBUMIN in the last 168 hours. No results for input(s): LIPASE, AMYLASE in the last 168 hours. No results for input(s): AMMONIA in the last 168 hours. Coagulation Profile: No results for input(s): INR, PROTIME in the last 168 hours. Cardiac Enzymes: No results for input(s): CKTOTAL, CKMB, CKMBINDEX, TROPONINI in the last 168 hours. BNP (last 3 results) No results for input(s): PROBNP in the last 8760 hours. HbA1C: No results for input(s): HGBA1C in the last 72 hours. CBG: No results for input(s): GLUCAP in the last 168 hours. Lipid Profile: No results for input(s): CHOL, HDL, LDLCALC, TRIG, CHOLHDL, LDLDIRECT in the last 72 hours. Thyroid Function Tests: No results for input(s): TSH, T4TOTAL, FREET4, T3FREE, THYROIDAB in the last 72 hours. Anemia Panel: No results for input(s): VITAMINB12, FOLATE, FERRITIN, TIBC, IRON, RETICCTPCT in the last 72 hours. Urine analysis: No results found for: COLORURINE, APPEARANCEUR, LABSPEC, PHURINE, GLUCOSEU, HGBUR, BILIRUBINUR, KETONESUR, PROTEINUR, UROBILINOGEN, NITRITE, LEUKOCYTESUR  Radiological Exams on Admission: DG Knee Complete 4 Views Right  Result Date: 08/06/2021 CLINICAL DATA:  Provided history: Fall with right hip and knee pain. Additional history provided: Severe pain to right hip and right knee. EXAM: RIGHT KNEE - COMPLETE 4+ VIEW COMPARISON:  Report from radiographs of the right knee  04/23/2016 (images unavailable). FINDINGS: There is normal bony alignment. No evidence of acute osseous or articular abnormality. Mild medial and lateral tibiofemoral compartment joint space narrowing. Partially imaged chronic, healed fracture deformity of the fibular diaphysis. Vascular calcifications. IMPRESSION: No evidence of acute osseous or articular abnormality. Electronically Signed   By: Jackey Loge D.O.   On: 08/06/2021 14:32   DG Hip Unilat With Pelvis 2-3 Views Right  Result Date: 08/06/2021 CLINICAL DATA:  Right hip pain after fall. EXAM: DG HIP (WITH OR WITHOUT PELVIS) 2-3V RIGHT COMPARISON:  Right hip x-ray report dated April 22, 2021. CT abdomen pelvis dated May 22, 2019. FINDINGS: Acute impacted right subcapital femoral neck fracture. Healing subacute fracture of the right inferior pubic ramus. Old healed fracture of the left inferior pubic ramus. The hip joint spaces are preserved. Osteopenia. Soft tissues are unremarkable. IMPRESSION: 1. Acute impacted right subcapital femoral neck fracture. 2. Subacute, healing fracture of the right inferior pubic ramus. Electronically Signed   By: Obie Dredge M.D.   On: 08/06/2021 14:32     Assessment/Plan Principal Problem:   Femoral neck fracture (HCC) Active Problems:   Hereditary spastic paraparesis (HCC)   Depression      Femoral neck fracture (Right) Immobilize right lower extremity Pain control Place patient on muscle relaxants Orthopedic surgery consult     History of hereditary spastic paraparesis Resulting in frequent falls with prior fractures Stable.  Continue primidone    Depression Stable Continue Lexapro  DVT prophylaxis: SCD  Code Status: full code  Family Communication: Greater than 50% of time was spent discussing patient's condition and plan of care with her and her husband at the bedside.  All questions and concerns have been addressed.  She verbalizes understanding and agrees with the  plan. Disposition Plan: Back to previous home environment Consults called: Orthopedic surgery  Status:At the time of admission, it appears that the appropriate admission status for this patient is inpatient. This is judged to be reasonable and necessary to provide the required intensity of service to ensure the patient's safety given the presenting symptoms, physical exam findings, and initial radiographic and laboratory data in the context of their comorbid conditions. Patient requires inpatient status due to high intensity  of service, high risk for further deterioration and high frequency of surveillance required.     Lucile Shutters MD [facility]     08/06/2021, 3:36 PM

## 2021-08-06 NOTE — Consult Note (Signed)
ORTHOPAEDIC CONSULTATION  REQUESTING PHYSICIAN: Lucile Shutters, MD  Chief Complaint: Right hip pain  HPI: Joy Alexander is a 75 y.o. female who complains of right hip pain after mechanical fall.  Patient lives at [facility] and had a fall onto the right hip earlier today.  Patient ambulates with the use of a cane or walker.  She does not take any anticoagulation.  Past medical history notable for hereditary spastic paraparesis.  Orthopedics was consulted regarding operative management of the right femoral neck fracture.   Past Medical History:  Diagnosis Date   Anxiety    Arthritis    Complication of anesthesia    Depression    Diabetes mellitus without complication (HCC)    Family history of adverse reaction to anesthesia    sister PONV   Hereditary spastic paraparesis (HCC)    Hyperlipemia    Osteopenia    Peripheral vascular disease (HCC)    carotid artery stenosis   PONV (postoperative nausea and vomiting)    Tremors of nervous system    Past Surgical History:  Procedure Laterality Date   APPENDECTOMY     CLOSED MANIPULATION SHOULDER WITH STERIOD INJECTION Left 03/22/2019   Procedure: CLOSED MANIPULATION SHOULDER WITH STEROID INJECTION;  Surgeon: Christena Flake, MD;  Location: [facility] ORS;  Service: Orthopedics;  Laterality: Left;   COLONOSCOPY     COLONOSCOPY WITH PROPOFOL N/A 06/27/2019   Procedure: COLONOSCOPY WITH PROPOFOL;  Surgeon: Toledo, Boykin Nearing, MD;  Location: [facility];  Service: Gastroenterology;  Laterality: N/A;   EYE SURGERY     FRACTURE SURGERY Right    fib tib ankle   fx tibia /fibia     TONSILLECTOMY     Social History   Socioeconomic History   Marital status: Married    Spouse name: Not on file   Number of children: Not on file   Years of education: Not on file   Highest education level: Not on file  Occupational History   Not on file  Tobacco Use   Smoking status: Never   Smokeless tobacco: Never  Vaping Use   Vaping Use:  Never used  Substance and Sexual Activity   Alcohol use: Yes    Comment: 1-2 glasses of wine per week   Drug use: Never   Sexual activity: Not on file  Other Topics Concern   Not on file  Social History Narrative   Not on file   Social Determinants of Health   Financial Resource Strain: Not on file  Food Insecurity: Not on file  Transportation Needs: Not on file  Physical Activity: Not on file  Stress: Not on file  Social Connections: Not on file   Family History  Problem Relation Age of Onset   Breast cancer Neg Hx    Allergies  Allergen Reactions   Midazolam Nausea And Vomiting   Mobic [Meloxicam] Nausea And Vomiting   Tramadol Nausea And Vomiting   Prior to Admission medications   Medication Sig Start Date End Date Taking? Authorizing Provider  acetaminophen (TYLENOL) 650 MG CR tablet Take 650 mg by mouth 2 (two) times a day.   Yes [provider]  aspirin EC 81 MG tablet Take 81 mg by mouth at bedtime.    Yes [provider]  Cholecalciferol (VITAMIN D3) 50 MCG (2000 UT) TABS Take 2,000 Units by mouth daily.   Yes [provider]  escitalopram (LEXAPRO) 20 MG tablet Take 20 mg by mouth daily.   Yes [provider]  gabapentin (NEURONTIN) 300 MG capsule Take 600 mg by mouth at bedtime.    Yes [provider]  ibandronate (BONIVA) 150 MG tablet Take 150 mg by mouth every 30 (thirty) days. Take in the morning with a full glass of water, on an empty stomach, and do not take anything else by mouth or lie down for the next 30 min.   Yes [provider]  primidone (MYSOLINE) 250 MG tablet Take 250 mg by mouth 2 (two) times daily. 08/04/21  Yes [provider]  simvastatin (ZOCOR) 10 MG tablet Take 10 mg by mouth at bedtime.    Yes [provider]  carboxymethylcellulose (REFRESH PLUS) 0.5 % SOLN Place 1 drop into both eyes 3 (three) times daily as needed (dry/irritated eyes.).    [provider]   ibuprofen (ADVIL) 200 MG tablet Take 200 mg by mouth every 8 (eight) hours as needed (pain).    [provider]   CT Hip Right Wo Contrast  Result Date: 08/06/2021 CLINICAL DATA:  Hip trauma, fracture suspected, xray done EXAM: CT OF THE RIGHT HIP WITHOUT CONTRAST TECHNIQUE: Multidetector CT imaging of the right hip was performed according to the standard protocol. Multiplanar CT image reconstructions were also generated. COMPARISON:  Same-day x-ray FINDINGS: Bones/Joint/Cartilage Acute impacted fracture of the right femoral neck, predominantly subcapital. Mild anterior apex angulation. Slight external rotation. No fracture involvement of the intertrochanteric region. No evidence of fracture involvement of the femoral head articular surface. No dislocation. Subacute healing nondisplaced fracture of the mid right inferior pubic ramus with callus formation. No additional fractures. No suspicious bone lesion. Ligaments Suboptimally assessed by CT. Muscles and Tendons No acute musculotendinous abnormality by CT. Soft tissues No soft tissue hematoma. No right inguinal lymphadenopathy. Scattered atherosclerotic calcifications. IMPRESSION: 1. Acute impacted fracture of the right femoral neck, predominantly subcapital. 2. Subacute healing nondisplaced fracture of the mid right inferior pubic ramus. Electronically Signed   By: Duanne Guess D.O.   On: 08/06/2021 15:49   DG Chest Port 1 View  Result Date: 08/06/2021 CLINICAL DATA:  Provided history: Preop EXAM: PORTABLE CHEST 1 VIEW COMPARISON:  No pertinent prior exams available for comparison. FINDINGS: Heart size within normal limits. Aortic atherosclerosis. No appreciable airspace consolidation. No evidence of pleural effusion or pneumothorax. No acute bony abnormality identified. Thoracic dextrocurvature. IMPRESSION: No evidence of acute cardiopulmonary abnormality. Aortic Atherosclerosis (ICD10-I70.0). Thoracic dextrocurvature. Electronically  Signed   By: Jackey Loge D.O.   On: 08/06/2021 15:59   DG Knee Complete 4 Views Right  Result Date: 08/06/2021 CLINICAL DATA:  Provided history: Fall with right hip and knee pain. Additional history provided: Severe pain to right hip and right knee. EXAM: RIGHT KNEE - COMPLETE 4+ VIEW COMPARISON:  Report from radiographs of the right knee 04/23/2016 (images unavailable). FINDINGS: There is normal bony alignment. No evidence of acute osseous or articular abnormality. Mild medial and lateral tibiofemoral compartment joint space narrowing. Partially imaged chronic, healed fracture deformity of the fibular diaphysis. Vascular calcifications. IMPRESSION: No evidence of acute osseous or articular abnormality. Electronically Signed   By: Jackey Loge D.O.   On: 08/06/2021 14:32   DG Hip Unilat With Pelvis 2-3 Views Right  Result Date: 08/06/2021 CLINICAL DATA:  Right hip pain after fall. EXAM: DG HIP (WITH OR WITHOUT PELVIS) 2-3V RIGHT COMPARISON:  Right hip x-ray report dated April 22, 2021. CT abdomen pelvis dated May 22, 2019. FINDINGS: Acute impacted right subcapital femoral neck fracture. Healing subacute fracture of  the right inferior pubic ramus. Old healed fracture of the left inferior pubic ramus. The hip joint spaces are preserved. Osteopenia. Soft tissues are unremarkable. IMPRESSION: 1. Acute impacted right subcapital femoral neck fracture. 2. Subacute, healing fracture of the right inferior pubic ramus. Electronically Signed   By: Obie Dredge M.D.   On: 08/06/2021 14:32    Positive ROS: All other systems have been reviewed and were otherwise negative with the exception of those mentioned in the HPI and as above.  Physical Exam: General: Alert, no acute distress Cardiovascular: No pedal edema Respiratory: No cyanosis, no use of accessory musculature GI: No organomegaly, abdomen is soft and non-tender Skin: No lesions in the area of chief complaint Neurologic: Sensation intact  distally Psychiatric: Patient is competent for consent with normal mood and affect Lymphatic: No axillary or cervical lymphadenopathy  MUSCULOSKELETAL:  Right lower extremity: Tenderness about the right hip, range of motion deferred, no tenderness about the knee or ankle.  Compartments soft. Good cap refill. Motor and sensory intact distally.  Assessment: 75 year old female admitted with a displaced right femoral neck fracture  Plan: - Appreciate hospitalist team support - I had a long discussion with the patient regarding her x-ray and CT findings as well as potential treatment options.  After discussion, recommendation was made for right hip hemiarthroplasty pending medical clearance.  Risks of surgery were discussed in detail.  Informed consent was provided. - We will tentatively plan for surgery Saturday morning, 12/10    Ross Marcus, MD    08/06/2021 5:52 PM

## 2021-08-06 NOTE — ED Notes (Signed)
Pt taken for X-ray at this time.  

## 2021-08-06 NOTE — ED Provider Notes (Signed)
[facility] Emergency Department Provider Note   ____________________________________________   Event Date/Time   First MD Initiated Contact with Patient 08/06/21 1501     (approximate)  I have reviewed the triage vital signs and the nursing notes.   HISTORY  Chief Complaint Fall (R Hip)    HPI Joy Alexander is a 75 y.o. female who presents after a fall via EMS complaining of right hip and knee pain  LOCATION: Right hip and knee DURATION: Just prior to arrival TIMING: Stable since onset SEVERITY: Severe QUALITY: Sharp/aching pain CONTEXT: Patient had mechanical fall from standing resulting in a hard fall onto her right hip and subsequent hip and knee pain MODIFYING FACTORS: Any movement or palpation around his hip worsens her pain and is partially relieved with analgesics ASSOCIATED SYMPTOMS: Denies   Per medical record review, patient has history of osteopenia          Past Medical History:  Diagnosis Date   Anxiety    Arthritis    Complication of anesthesia    Depression    Diabetes mellitus without complication (HCC)    Family history of adverse reaction to anesthesia    sister PONV   Hereditary spastic paraparesis (HCC)    Hyperlipemia    Osteopenia    Peripheral vascular disease (HCC)    carotid artery stenosis   PONV (postoperative nausea and vomiting)    Tremors of nervous system     Patient Active Problem List   Diagnosis Date Noted   Femoral neck fracture (HCC) 08/06/2021    Past Surgical History:  Procedure Laterality Date   APPENDECTOMY     CLOSED MANIPULATION SHOULDER WITH STERIOD INJECTION Left 03/22/2019   Procedure: CLOSED MANIPULATION SHOULDER WITH STEROID INJECTION;  Surgeon: Christena Flake, MD;  Location: [facility] ORS;  Service: Orthopedics;  Laterality: Left;   COLONOSCOPY     COLONOSCOPY WITH PROPOFOL N/A 06/27/2019   Procedure: COLONOSCOPY WITH PROPOFOL;  Surgeon: Toledo, Boykin Nearing, MD;  Location:  [facility];  Service: Gastroenterology;  Laterality: N/A;   EYE SURGERY     FRACTURE SURGERY Right    fib tib ankle   fx tibia /fibia     TONSILLECTOMY      Prior to Admission medications   Medication Sig Start Date End Date Taking? Authorizing Provider  acetaminophen (TYLENOL 8 HOUR) 650 MG CR tablet Take 650 mg by mouth 2 (two) times a day.    [provider]  aspirin EC 81 MG tablet Take 81 mg by mouth at bedtime.     [provider]  carboxymethylcellulose (REFRESH PLUS) 0.5 % SOLN Place 1 drop into both eyes 3 (three) times daily as needed (dry/irritated eyes.).    [provider]  Cholecalciferol (VITAMIN D3) 50 MCG (2000 UT) TABS Take 2,000 Units by mouth daily.    [provider]  escitalopram (LEXAPRO) 20 MG tablet Take 20 mg by mouth daily.    [provider]  gabapentin (NEURONTIN) 300 MG capsule Take 600 mg by mouth at bedtime.     [provider]  ibandronate (BONIVA) 150 MG tablet Take 150 mg by mouth every 30 (thirty) days. Take in the morning with a full glass of water, on an empty stomach, and do not take anything else by mouth or lie down for the next 30 min.    [provider]  ibuprofen (ADVIL) 200 MG tablet Take 200 mg by mouth every 8 (eight) hours as needed (pain).  [provider]  primidone (MYSOLINE) 250 MG tablet Take 250 mg by mouth 2 (two) times daily. 08/04/21   [provider]  simvastatin (ZOCOR) 10 MG tablet Take 10 mg by mouth at bedtime.     [provider]    Allergies Midazolam, Mobic [meloxicam], and Tramadol  Family History  Problem Relation Age of Onset   Breast cancer Neg Hx     Social History Social History   Tobacco Use   Smoking status: Never   Smokeless tobacco: Never  Vaping Use   Vaping Use: Never used  Substance Use Topics   Alcohol use: Yes    Comment: 1-2 glasses of wine per week   Drug use: Never    Review of  Systems Constitutional: No fever/chills Eyes: No visual changes. ENT: No sore throat. Cardiovascular: Denies chest pain. Respiratory: Denies shortness of breath. Gastrointestinal: No abdominal pain.  No nausea, no vomiting.  No diarrhea. Genitourinary: Negative for dysuria. Musculoskeletal: Positive for acute right hip and knee pain Skin: Negative for rash. Neurological: Negative for headaches, weakness/numbness/paresthesias in any extremity Psychiatric: Negative for suicidal ideation/homicidal ideation   ____________________________________________   PHYSICAL EXAM:  VITAL SIGNS: ED Triage Vitals  Enc Vitals Group     BP 08/06/21 1312 137/66     Pulse Rate 08/06/21 1312 77     Resp 08/06/21 1312 19     Temp 08/06/21 1312 98.9 F (37.2 C)     Temp Source 08/06/21 1312 Oral     SpO2 08/06/21 1312 97 %     Weight 08/06/21 1308 115 lb 8 oz (52.4 kg)     Height 08/06/21 1308 5\' 3"  (1.6 m)     Head Circumference --      Peak Flow --      Pain Score 08/06/21 1307 8     Pain Loc --      Pain Edu? --      Excl. in GC? --    Constitutional: Alert and oriented. Well appearing and in no acute distress. Eyes: Conjunctivae are normal. PERRL. Head: Atraumatic. Nose: No congestion/rhinnorhea. Mouth/Throat: Mucous membranes are moist. Neck: No stridor Cardiovascular: Grossly normal heart sounds.  Good peripheral circulation. Respiratory: Normal respiratory effort.  No retractions. Gastrointestinal: Soft and nontender. No distention. Musculoskeletal: Significant tenderness with range of motion of the right hip as well as palpation over the right lateral no obvious deformities Neurologic:  Normal speech and language. No gross focal neurologic deficits are appreciated. Skin:  Skin is warm and dry. No rash noted. Psychiatric: Mood and affect are normal. Speech and behavior are normal.  ____________________________________________   LABS (all labs ordered are listed, but only  abnormal results are displayed)  Labs Reviewed  RESP PANEL BY RT-PCR (FLU A&B, COVID) [id number]   ____________________________________________  RADIOLOGY  ED MD interpretation: 4 view x-ray of the right knee shows no evidence of acute osseous or articular abnormalities  Three-view x-ray of the right hip shows an acute impacted right subcapital femoral neck fracture with a subacute healing fracture of the right inferior pubic ramus  Official radiology report(s): DG Knee Complete 4 Views Right  Result Date: 08/06/2021 CLINICAL DATA:  Provided history: Fall with right hip and knee pain. Additional history provided: Severe pain to right hip and right knee. EXAM: RIGHT KNEE - COMPLETE 4+ VIEW COMPARISON:  Report from radiographs of the right knee 04/23/2016 (images unavailable). FINDINGS: There is normal bony alignment. No evidence of acute osseous or articular abnormality. Mild medial  and lateral tibiofemoral compartment joint space narrowing. Partially imaged chronic, healed fracture deformity of the fibular diaphysis. Vascular calcifications. IMPRESSION: No evidence of acute osseous or articular abnormality. Electronically Signed   By: Jackey Loge D.O.   On: 08/06/2021 14:32   DG Hip Unilat With Pelvis 2-3 Views Right  Result Date: 08/06/2021 CLINICAL DATA:  Right hip pain after fall. EXAM: DG HIP (WITH OR WITHOUT PELVIS) 2-3V RIGHT COMPARISON:  Right hip x-ray report dated April 22, 2021. CT abdomen pelvis dated May 22, 2019. FINDINGS: Acute impacted right subcapital femoral neck fracture. Healing subacute fracture of the right inferior pubic ramus. Old healed fracture of the left inferior pubic ramus. The hip joint spaces are preserved. Osteopenia. Soft tissues are unremarkable. IMPRESSION: 1. Acute impacted right subcapital femoral neck fracture. 2. Subacute, healing fracture of the right inferior pubic ramus. Electronically Signed   By: Obie Dredge M.D.   On: 08/06/2021 14:32     ____________________________________________   PROCEDURES  Procedure(s) performed (including Critical Care):  Procedures   ____________________________________________   INITIAL IMPRESSION / ASSESSMENT AND PLAN / ED COURSE  As part of my medical decision making, I reviewed the following data within the electronic medical record, if available:  Nursing notes reviewed and incorporated, Labs reviewed, EKG interpreted, Old chart reviewed, Radiograph reviewed and Notes from prior ED visits reviewed and incorporated        Patient is a 75 year old female that presents for right hip pain Workup: XR hip Findings: Right femoral neck without dislocation Consult: Orthopedic Surgery (query fascia iliacus block vs continued opiate pain control), hospitalist  Patient does not currently demonstrate complications of fracture such as compartment syndrome, arterial or nerve injury.  Interventions: analgesia Disposition: Admit      ____________________________________________   FINAL CLINICAL IMPRESSION(S) / ED DIAGNOSES  Final diagnoses:  Closed fracture of neck of right femur, initial encounter (HCC)  Fall, initial encounter     ED Discharge Orders     None        Note:  This document was prepared using Dragon voice recognition software and may include unintentional dictation errors.    Merwyn Katos, MD 08/06/21 [id number]

## 2021-08-06 NOTE — Progress Notes (Signed)
Initial Nutrition Assessment  DOCUMENTATION CODES:   Not applicable  INTERVENTION:   -Once diet is advanced, add:   -Glucerna Shake po TID, each supplement provides 220 kcal and 10 grams of protein  -MVI with minerals daily  NUTRITION DIAGNOSIS:   Increased nutrient needs related to post-op healing as evidenced by estimated needs.  GOAL:   Patient will meet greater than or equal to 90% of their needs  MONITOR:   PO intake, Supplement acceptance, Diet advancement, Labs, Weight trends, Skin, I & O's  REASON FOR ASSESSMENT:   Consult Assessment of nutrition requirement/status, Hip fracture protocol  ASSESSMENT:   Joy Alexander is a 75 y.o. female who presents after a fall via EMS complaining of right hip and knee pain  Pt admitted with closed rt femur fracture.   Pt unavailable at time of visit. RD unable to obtain further nutrition-related history or complete nutrition-focused physical exam at this time.     Pt currently NPO. Pt awaiting orthopedics consult for potential surgery.   Reviewed wt hx; noted no wt loss over the past 2 years.   Medications reviewed and include senokot and vitamin D3.   No results found for: HGBA1C PTA DM medications are none.   Labs reviewed: CBGS: 145 (inpatient orders for glycemic control are none).    Diet Order:   Diet Order             Diet NPO time specified  Diet effective now                   EDUCATION NEEDS:   No education needs have been identified at this time  Skin:  Skin Assessment: Reviewed RN Assessment  Last BM:  Unknown  Height:   Ht Readings from Last 1 Encounters:  08/06/21 5\' 3"  (1.6 m)    Weight:   Wt Readings from Last 1 Encounters:  08/06/21 52.4 kg    Ideal Body Weight:  52.3 kg  BMI:  Body mass index is 20.46 kg/m.  Estimated Nutritional Needs:   Kcal:  1550-1750  Protein:  70-85 grams  Fluid:  > 1.5 L    14/08/22, RD, LDN, CDCES Registered Dietitian  II Certified Diabetes Care and Education Specialist Please refer to [facility] for RD and/or RD on-call/weekend/after hours pager

## 2021-08-06 NOTE — ED Triage Notes (Signed)
Pt from home via [facility], Pt states they were walking around their house, lost their balance and fell onto R hip onto a chest. Reports severe pain to R hip and R knee. Unable to assess shortening or outward rotation d/t pt not straightening knee.

## 2021-08-06 NOTE — ED Triage Notes (Signed)
Pt states they frequently have mechanical falls at home.

## 2021-08-07 DIAGNOSIS — W19XXXA Unspecified fall, initial encounter: Secondary | ICD-10-CM

## 2021-08-07 LAB — GLUCOSE, CAPILLARY
Glucose-Capillary: 134 mg/dL — ABNORMAL HIGH (ref 70–99)
Glucose-Capillary: 149 mg/dL — ABNORMAL HIGH (ref 70–99)

## 2021-08-07 LAB — SURGICAL PCR SCREEN
MRSA, PCR: NEGATIVE
Staphylococcus aureus: POSITIVE — AB

## 2021-08-07 MED ORDER — SODIUM CHLORIDE 0.9 % IV SOLN
12.5000 mg | Freq: Four times a day (QID) | INTRAVENOUS | Status: DC | PRN
  Administered 2021-08-07: 22:00:00 12.5 mg via INTRAVENOUS
  Filled 2021-08-07: qty 12.5

## 2021-08-07 MED ORDER — SODIUM CHLORIDE 0.9 % IV SOLN
6.2500 mg | Freq: Four times a day (QID) | INTRAVENOUS | Status: DC | PRN
  Filled 2021-08-07: qty 0.25

## 2021-08-07 MED ORDER — CHLORHEXIDINE GLUCONATE CLOTH 2 % EX PADS
6.0000 | MEDICATED_PAD | Freq: Every day | CUTANEOUS | Status: DC
  Administered 2021-08-07 – 2021-08-10 (×3): 6 via TOPICAL

## 2021-08-07 MED ORDER — LACTATED RINGERS IV SOLN: INTRAVENOUS | Status: DC

## 2021-08-07 MED ORDER — SENNA 8.6 MG PO TABS: 1.0000 | ORAL_TABLET | Freq: Every day | ORAL | Status: DC | PRN

## 2021-08-07 MED ORDER — POLYETHYLENE GLYCOL 3350 17 G PO PACK
17.0000 g | PACK | Freq: Every day | ORAL | Status: DC
  Administered 2021-08-09: 17 g via ORAL
  Filled 2021-08-07 (×2): qty 1

## 2021-08-07 MED ORDER — HYDROMORPHONE HCL 1 MG/ML IJ SOLN
0.5000 mg | Freq: Two times a day (BID) | INTRAMUSCULAR | Status: DC | PRN
  Administered 2021-08-08: 0.5 mg via INTRAVENOUS
  Filled 2021-08-07: qty 1

## 2021-08-07 MED ORDER — MUPIROCIN 2 % EX OINT
1.0000 "application " | TOPICAL_OINTMENT | Freq: Two times a day (BID) | CUTANEOUS | Status: DC
  Administered 2021-08-07 – 2021-08-10 (×6): 1 via NASAL
  Filled 2021-08-07: qty 22

## 2021-08-07 NOTE — TOC Progression Note (Signed)
Transition of Care [facility]) - Progression Note    Patient Details  Name: Joy Alexander MRN: [id number] Date of Birth: 1945-12-04  Transition of Care [facility] CM/SW Contact  Marlowe Sax, RN Phone Number: 08/07/2021, 9:35 AM  Clinical Narrative:    The patient lives at the [facility], She walks with a cane or walker at baseline, Surgery is planned for Saturday, PT to eval afterwards, Anticipate needing to go to STR at [facility], I notified [facility] of the possibility        Expected Discharge Plan and Services                                                 Social Determinants of Health (SDOH) Interventions    Readmission Risk Interventions No flowsheet data found.

## 2021-08-07 NOTE — Anesthesia Preprocedure Evaluation (Addendum)
Anesthesia Evaluation  Patient identified by MRN, date of birth, ID band Patient awake    Reviewed: Allergy & Precautions, NPO status , Patient's Chart, lab work & pertinent test results  History of Anesthesia Complications (+) PONV and history of anesthetic complications  Airway Mallampati: II       Dental  (+) Poor Dentition   Pulmonary neg sleep apnea, neg COPD, Not current smoker,    Pulmonary exam normal        Cardiovascular Exercise Tolerance: Poor METS: 3 - Mets (-) hypertension+ Peripheral Vascular Disease  (-) Past MI and (-) CHF Normal cardiovascular exam(-) dysrhythmias (-) Valvular Problems/Murmurs  Carotid artery stenosis   Neuro/Psych neg Seizures PSYCHIATRIC DISORDERS Anxiety Depression Hereditary spastic paraparesis     GI/Hepatic Neg liver ROS, neg GERD  ,  Endo/Other  diabetes (last A1C 6), Well Controlled, Type 2  Renal/GU negative Renal ROS     Musculoskeletal  (+) Arthritis , Osteoarthritis,    Abdominal Normal abdominal exam  (+)   Peds  Hematology negative hematology ROS (+)   Anesthesia Other Findings   Reproductive/Obstetrics                            Anesthesia Physical  Anesthesia Plan  ASA: II  Anesthesia Plan: General   Post-op Pain Management:    Induction: Intravenous  PONV Risk Score and Plan: 4 or greater and Propofol infusion, Ondansetron, Treatment may vary due to age or medical condition and Dexamethasone  Airway Management Planned: Oral ETT  Additional Equipment:   Intra-op Plan:   Post-operative Plan:   Informed Consent: I have reviewed the patients History and Physical, chart, labs and discussed the procedure including the risks, benefits and alternatives for the proposed anesthesia with the patient or authorized representative who has indicated his/her understanding and acceptance.     Dental advisory given  Plan Discussed with:  CRNA and Anesthesiologist  Anesthesia Plan Comments:        Anesthesia Quick Evaluation

## 2021-08-07 NOTE — Progress Notes (Signed)
Nutrition Follow-up  DOCUMENTATION CODES:   Not applicable  INTERVENTION:   -Continue Glucerna Shake po TID, each supplement provides 220 kcal and 10 grams of protein  -Continue MVI with minerals daily  NUTRITION DIAGNOSIS:   Increased nutrient needs related to post-op healing as evidenced by estimated needs.  Ongoing  GOAL:   Patient will meet greater than or equal to 90% of their needs  Progressing   MONITOR:   PO intake, Supplement acceptance, Diet advancement, Labs, Weight trends, Skin, I & O's  REASON FOR ASSESSMENT:   Consult Assessment of nutrition requirement/status, Hip fracture protocol  ASSESSMENT:   Joy Alexander is a 75 y.o. female who presents after a fall via EMS complaining of right hip and knee pain  Reviewed I/O's: -600 ml x 24 hours  UOP: 600 ml x 24 hours  Per orthopedics notes, plan for surgery tomorrow (08/07/21).  Pt working with therapy at time of visit. Pt did not have any observed signs of fat and muscle depletions.   Pt on a regular diet. Noted meal completions 50-90%.    Medications reviewed and include vitamin D and miralax.   Labs reviewed: CBGS: 134-199 (inpatient orders for glycemic control are none).    Diet Order:   Diet Order             Diet NPO time specified  Diet effective midnight           Diet regular Room service appropriate? Yes; Fluid consistency: Thin  Diet effective now                   EDUCATION NEEDS:   No education needs have been identified at this time  Skin:  Skin Assessment: Reviewed RN Assessment  Last BM:  Unknown  Height:   Ht Readings from Last 1 Encounters:  08/06/21 5\' 3"  (1.6 m)    Weight:   Wt Readings from Last 1 Encounters:  08/06/21 52.4 kg    Ideal Body Weight:  52.3 kg  BMI:  Body mass index is 20.46 kg/m.  Estimated Nutritional Needs:   Kcal:  1550-1750  Protein:  70-85 grams  Fluid:  > 1.5 L    14/08/22, RD, LDN, CDCES Registered Dietitian  II Certified Diabetes Care and Education Specialist Please refer to [facility] for RD and/or RD on-call/weekend/after hours pager

## 2021-08-07 NOTE — Progress Notes (Signed)
PROGRESS NOTE  Joy Alexander    DOB: 10/15/1945, 75 y.o.  NLG:[id number]  PCP: Lauro Regulus, MD   Code Status: Full Code   DOA: 08/06/2021   LOS: 1  Brief Narrative of Current Hospitalization  Joy Alexander is a 75 y.o. female with a PMH significant for hereditary spastic paraplegia, depression/anxiety. They presented from [facility] to the ED on 08/06/2021 with fall. In the ED, it was found that they had R femoral neck fracture. They were treated with analgesia, antiemetics, IV fluids.  Ortho surgery was consulted.  Patient was admitted to medicine service for further workup and management of chronic conditions as outlined in detail below.  08/07/21 -stable  Assessment & Plan  Principal Problem:   Femoral neck fracture (HCC) Active Problems:   Hereditary spastic paraparesis (HCC)   Depression  R femoral neck fracture- - ortho managing  - planning surgical fixation 12/10 -Analgesia as needed -Antiemetics. -N.p.o. at midnight -LR maintenance starting once n.p.o. -PT/OT to start postop  Anxiety/depression-chronic, stable -Continue home Lexapro  Hereditary spastic paraplegia-chronic, stable -Continue home primidone -Fall precautions  DVT prophylaxis: SCDs Start: 08/06/21 1518   Diet:  Diet Orders (From admission, onward)     Start     Ordered   08/06/21 1742  Diet regular Room service appropriate? Yes; Fluid consistency: Thin  Diet effective now       Question Answer Comment  Room service appropriate? Yes   Fluid consistency: Thin      08/06/21 1743            Subjective 08/07/21    Pt reports 8/8 hip pain due to movement. At rest is moderately well controlled. She experiences nausea with pain medications at baseline so would like to prophylactically take antiemetics prior to receiving pain medications.  Disposition Plan & Communication  Patient status: Inpatient  Admitted From: ALF Disposition: Assisted living Anticipated discharge date:  12/11  Family Communication: None Consults, Procedures, Significant Events  Consultants:  Ortho surgery  Procedures/significant events:  12/10-IRIF right hip Antimicrobials:  Anti-infectives (From admission, onward)    Start     Dose/Rate Route Frequency Ordered Stop   08/08/21 0600  ceFAZolin (ANCEF) IVPB 2g/100 mL premix        2 g 200 mL/hr over 30 Minutes Intravenous On call to O.R. 08/06/21 1757 08/09/21 0559       Objective   Vitals:   08/06/21 1312 08/06/21 1330 08/06/21 1944 08/07/21 0443  BP: 137/66 135/65 127/73 (!) 104/57  Pulse: 77 71 80 67  Resp: 19 14 17 16   Temp: 98.9 F (37.2 C)  98 F (36.7 C) 98.3 F (36.8 C)  TempSrc: Oral     SpO2: 97% 95% 95% 96%  Weight:      Height:        Intake/Output Summary (Last 24 hours) at 08/07/2021 0659 Last data filed at 08/06/2021 2250 Gross per 24 hour  Intake --  Output 600 ml  Net -600 ml   Filed Weights   08/06/21 1308  Weight: 52.4 kg    Patient BMI: Body mass index is 20.46 kg/m.   Physical Exam: General: awake, alert, NAD Respiratory: normal respiratory effort. Cardiovascular: quick capillary refill  Nervous: A&O x3. no gross focal neurologic deficits, normal speech Extremities: decreased motion of right hip. No gross deformity. normal tone Skin: dry, intact, normal temperature, normal color, No rashes, lesions or ulcers on exposed skin Psychiatry: normal mood, congruent affect  Labs   I have  personally reviewed following labs and imaging studies Admission on 08/06/2021  Component Date Value Ref Range Status   SARS Coronavirus 2 by RT PCR 08/06/2021 NEGATIVE  NEGATIVE Final   Influenza A by PCR 08/06/2021 NEGATIVE  NEGATIVE Final   Influenza B by PCR 08/06/2021 NEGATIVE  NEGATIVE Final   Sodium 08/06/2021 137  135 - 145 mmol/L Final   Potassium 08/06/2021 3.9  3.5 - 5.1 mmol/L Final   Chloride 08/06/2021 104  98 - 111 mmol/L Final   CO2 08/06/2021 27  22 - 32 mmol/L Final   Glucose, Bld  08/06/2021 176 (H)  70 - 99 mg/dL Final   BUN 73/53/2992 22  8 - 23 mg/dL Final   Creatinine, Ser 08/06/2021 0.49  0.44 - 1.00 mg/dL Final   Calcium 42/68/3419 8.5 (L)  8.9 - 10.3 mg/dL Final   Total Protein 62/22/9798 6.3 (L)  6.5 - 8.1 g/dL Final   Albumin 92/06/9416 3.8  3.5 - 5.0 g/dL Final   AST 40/81/4481 29  15 - 41 U/L Final   ALT 08/06/2021 28  0 - 44 U/L Final   Alkaline Phosphatase 08/06/2021 67  38 - 126 U/L Final   Total Bilirubin 08/06/2021 0.5  0.3 - 1.2 mg/dL Final   GFR, Estimated 08/06/2021 >60  >60 mL/min Final   Anion gap 08/06/2021 6  5 - 15 Final   Troponin I (High Sensitivity) 08/06/2021 4  <18 ng/L Final   WBC 08/06/2021 5.2  4.0 - 10.5 K/uL Final   RBC 08/06/2021 3.83 (L)  3.87 - 5.11 MIL/uL Final   Hemoglobin 08/06/2021 12.3  12.0 - 15.0 g/dL Final   HCT 85/63/1497 36.8  36.0 - 46.0 % Final   MCV 08/06/2021 96.1  80.0 - 100.0 fL Final   MCH 08/06/2021 32.1  26.0 - 34.0 pg Final   MCHC 08/06/2021 33.4  30.0 - 36.0 g/dL Final   RDW 02/63/7858 12.6  11.5 - 15.5 % Final   Platelets 08/06/2021 161  150 - 400 K/uL Final   nRBC 08/06/2021 0.0  0.0 - 0.2 % Final   Neutrophils Relative % 08/06/2021 71  % Final   Neutro Abs 08/06/2021 3.7  1.7 - 7.7 K/uL Final   Lymphocytes Relative 08/06/2021 17  % Final   Lymphs Abs 08/06/2021 0.9  0.7 - 4.0 K/uL Final   Monocytes Relative 08/06/2021 9  % Final   Monocytes Absolute 08/06/2021 0.5  0.1 - 1.0 K/uL Final   Eosinophils Relative 08/06/2021 1  % Final   Eosinophils Absolute 08/06/2021 0.0  0.0 - 0.5 K/uL Final   Basophils Relative 08/06/2021 1  % Final   Basophils Absolute 08/06/2021 0.1  0.0 - 0.1 K/uL Final   Immature Granulocytes 08/06/2021 1  % Final   Abs Immature Granulocytes 08/06/2021 0.06  0.00 - 0.07 K/uL Final   ABO/RH(D) 08/06/2021 O POS   Final   Antibody Screen 08/06/2021 NEG   Final   Sample Expiration 08/06/2021    Final                   Value:08/09/2021,2359 Performed at [facility], [address] [place], [place] [address]    Troponin I (High Sensitivity) 08/06/2021 12  <18 ng/L Final   Glucose-Capillary 08/06/2021 199 (H)  70 - 99 mg/dL Final    Imaging Studies  CT Hip Right Wo Contrast  Result Date: 08/06/2021 CLINICAL DATA:  Hip trauma, fracture suspected, xray done EXAM: CT OF THE RIGHT  HIP WITHOUT CONTRAST TECHNIQUE: Multidetector CT imaging of the right hip was performed according to the standard protocol. Multiplanar CT image reconstructions were also generated. COMPARISON:  Same-day x-ray FINDINGS: Bones/Joint/Cartilage Acute impacted fracture of the right femoral neck, predominantly subcapital. Mild anterior apex angulation. Slight external rotation. No fracture involvement of the intertrochanteric region. No evidence of fracture involvement of the femoral head articular surface. No dislocation. Subacute healing nondisplaced fracture of the mid right inferior pubic ramus with callus formation. No additional fractures. No suspicious bone lesion. Ligaments Suboptimally assessed by CT. Muscles and Tendons No acute musculotendinous abnormality by CT. Soft tissues No soft tissue hematoma. No right inguinal lymphadenopathy. Scattered atherosclerotic calcifications. IMPRESSION: 1. Acute impacted fracture of the right femoral neck, predominantly subcapital. 2. Subacute healing nondisplaced fracture of the mid right inferior pubic ramus. Electronically Signed   By: Duanne Guess D.O.   On: 08/06/2021 15:49   DG Chest Port 1 View  Result Date: 08/06/2021 CLINICAL DATA:  Provided history: Preop EXAM: PORTABLE CHEST 1 VIEW COMPARISON:  No pertinent prior exams available for comparison. FINDINGS: Heart size within normal limits. Aortic atherosclerosis. No appreciable airspace consolidation. No evidence of pleural effusion or pneumothorax. No acute bony abnormality identified. Thoracic dextrocurvature. IMPRESSION: No evidence of acute cardiopulmonary abnormality. Aortic Atherosclerosis  (ICD10-I70.0). Thoracic dextrocurvature. Electronically Signed   By: Jackey Loge D.O.   On: 08/06/2021 15:59   DG Knee Complete 4 Views Right  Result Date: 08/06/2021 CLINICAL DATA:  Provided history: Fall with right hip and knee pain. Additional history provided: Severe pain to right hip and right knee. EXAM: RIGHT KNEE - COMPLETE 4+ VIEW COMPARISON:  Report from radiographs of the right knee 04/23/2016 (images unavailable). FINDINGS: There is normal bony alignment. No evidence of acute osseous or articular abnormality. Mild medial and lateral tibiofemoral compartment joint space narrowing. Partially imaged chronic, healed fracture deformity of the fibular diaphysis. Vascular calcifications. IMPRESSION: No evidence of acute osseous or articular abnormality. Electronically Signed   By: Jackey Loge D.O.   On: 08/06/2021 14:32   DG Hip Unilat With Pelvis 2-3 Views Right  Result Date: 08/06/2021 CLINICAL DATA:  Right hip pain after fall. EXAM: DG HIP (WITH OR WITHOUT PELVIS) 2-3V RIGHT COMPARISON:  Right hip x-ray report dated April 22, 2021. CT abdomen pelvis dated May 22, 2019. FINDINGS: Acute impacted right subcapital femoral neck fracture. Healing subacute fracture of the right inferior pubic ramus. Old healed fracture of the left inferior pubic ramus. The hip joint spaces are preserved. Osteopenia. Soft tissues are unremarkable. IMPRESSION: 1. Acute impacted right subcapital femoral neck fracture. 2. Subacute, healing fracture of the right inferior pubic ramus. Electronically Signed   By: Obie Dredge M.D.   On: 08/06/2021 14:32   Medications   Scheduled Meds:  acetaminophen  325 mg Oral Q6H   cholecalciferol  2,000 Units Oral Daily   escitalopram  20 mg Oral Daily   [START ON 08/08/2021] feeding supplement (GLUCERNA SHAKE)  237 mL Oral TID BM   gabapentin  600 mg Oral QHS   [START ON 08/08/2021] multivitamin with minerals  1 tablet Oral Daily   primidone  250 mg Oral BID    senna-docusate  1 tablet Oral QHS   simvastatin  10 mg Oral QHS   No recently discontinued medications to reconcile  LOS: 1 day   Time spent: >43min  Leeroy Bock, DO [facility] 08/07/2021, 6:59 AM   Please refer to amion to contact the [facility] Attending or Consulting provider  for this pt  [email or web address] Available by Epic secure chat 7AM-7PM. If 7PM-7AM, please contact night-coverage

## 2021-08-08 ENCOUNTER — Inpatient Hospital Stay: Payer: Medicare Other | Admitting: Anesthesiology

## 2021-08-08 ENCOUNTER — Encounter
Admission: EM | Disposition: A | Discharge status: Skilled Nursing Facility | Payer: Self-pay | Source: Home / Self Care | Attending: Student in an Organized Health Care Education/Training Program

## 2021-08-08 ENCOUNTER — Inpatient Hospital Stay: Payer: Medicare Other

## 2021-08-08 HISTORY — PX: HIP ARTHROPLASTY: SHX981

## 2021-08-08 LAB — URINALYSIS, COMPLETE (UACMP) WITH MICROSCOPIC
Bilirubin Urine: NEGATIVE
Glucose, UA: >=500 mg/dL — AB
Hgb urine dipstick: NEGATIVE
Ketones, ur: 20 mg/dL — AB
Nitrite: NEGATIVE
Protein, ur: NEGATIVE mg/dL
Specific Gravity, Urine: 1.021 (ref 1.005–1.030)
Squamous Epithelial / HPF: NONE SEEN (ref 0–5)
WBC, UA: >50 WBC/hpf — ABNORMAL HIGH (ref 0–5)
pH: 5 (ref 5.0–8.0)

## 2021-08-08 LAB — CBC
HCT: 39.3 % (ref 36.0–46.0)
Hemoglobin: 12.7 g/dL (ref 12.0–15.0)
MCH: 31.1 pg (ref 26.0–34.0)
MCHC: 32.3 g/dL (ref 30.0–36.0)
MCV: 96.3 fL (ref 80.0–100.0)
Platelets: 125 10*3/uL — ABNORMAL LOW (ref 150–400)
RBC: 4.08 MIL/uL (ref 3.87–5.11)
RDW: 12.4 % (ref 11.5–15.5)
WBC: 4.2 10*3/uL (ref 4.0–10.5)
nRBC: 0 % (ref 0.0–0.2)

## 2021-08-08 LAB — BASIC METABOLIC PANEL
Anion gap: 6 (ref 5–15)
BUN: 15 mg/dL (ref 8–23)
CO2: 33 mmol/L — ABNORMAL HIGH (ref 22–32)
Calcium: 8.9 mg/dL (ref 8.9–10.3)
Chloride: 102 mmol/L (ref 98–111)
Creatinine, Ser: 0.55 mg/dL (ref 0.44–1.00)
GFR, Estimated: >60 mL/min (ref >60)
Glucose, Bld: 123 mg/dL — ABNORMAL HIGH (ref 70–99)
Potassium: 4 mmol/L (ref 3.5–5.1)
Sodium: 141 mmol/L (ref 135–145)

## 2021-08-08 LAB — GLUCOSE, CAPILLARY: Glucose-Capillary: 199 mg/dL — ABNORMAL HIGH (ref 70–99)

## 2021-08-08 SURGERY — HEMIARTHROPLASTY, HIP, DIRECT ANTERIOR APPROACH, FOR FRACTURE
Anesthesia: General | Site: Hip | Laterality: Right

## 2021-08-08 MED ORDER — EPHEDRINE SULFATE 50 MG/ML IJ SOLN
INTRAMUSCULAR | Status: DC | PRN
  Administered 2021-08-08 (×2): 5 mg via INTRAVENOUS
  Administered 2021-08-08: 10 mg via INTRAVENOUS
  Administered 2021-08-08: 5 mg via INTRAVENOUS

## 2021-08-08 MED ORDER — DEXMEDETOMIDINE (PRECEDEX) IN NS 20 MCG/5ML (4 MCG/ML) IV SYRINGE
PREFILLED_SYRINGE | INTRAVENOUS | Status: DC | PRN
  Administered 2021-08-08 (×2): 4 ug via INTRAVENOUS

## 2021-08-08 MED ORDER — CEFAZOLIN SODIUM-DEXTROSE 1-4 GM/50ML-% IV SOLN
1.0000 g | Freq: Three times a day (TID) | INTRAVENOUS | Status: AC
  Administered 2021-08-08 – 2021-08-09 (×3): 1 g via INTRAVENOUS
  Filled 2021-08-08 (×3): qty 50

## 2021-08-08 MED ORDER — LIDOCAINE HCL (CARDIAC) PF 100 MG/5ML IV SOSY
PREFILLED_SYRINGE | INTRAVENOUS | Status: DC | PRN
  Administered 2021-08-08: 50 mg via INTRAVENOUS

## 2021-08-08 MED ORDER — ENOXAPARIN SODIUM 40 MG/0.4ML IJ SOSY
40.0000 mg | PREFILLED_SYRINGE | INTRAMUSCULAR | Status: DC
  Administered 2021-08-09 – 2021-08-10 (×2): 40 mg via SUBCUTANEOUS
  Filled 2021-08-08 (×2): qty 0.4

## 2021-08-08 MED ORDER — PROMETHAZINE HCL 25 MG/ML IJ SOLN
INTRAMUSCULAR | Status: AC
  Administered 2021-08-08: 6.25 mg via INTRAVENOUS
  Filled 2021-08-08: qty 1

## 2021-08-08 MED ORDER — KETAMINE HCL 10 MG/ML IJ SOLN
INTRAMUSCULAR | Status: DC | PRN
  Administered 2021-08-08 (×3): 5 mg via INTRAVENOUS

## 2021-08-08 MED ORDER — OXYCODONE HCL 5 MG PO TABS: 5.0000 mg | ORAL_TABLET | Freq: Once | ORAL | Status: DC | PRN

## 2021-08-08 MED ORDER — LIDOCAINE HCL (PF) 2 % IJ SOLN
INTRAMUSCULAR | Status: AC
  Filled 2021-08-08: qty 5

## 2021-08-08 MED ORDER — KETAMINE HCL 50 MG/5ML IJ SOSY
PREFILLED_SYRINGE | INTRAMUSCULAR | Status: AC
  Filled 2021-08-08: qty 5

## 2021-08-08 MED ORDER — PROPOFOL 10 MG/ML IV BOLUS
INTRAVENOUS | Status: AC
  Filled 2021-08-08: qty 20

## 2021-08-08 MED ORDER — ACETAMINOPHEN 10 MG/ML IV SOLN
INTRAVENOUS | Status: DC | PRN
  Administered 2021-08-08: 1000 mg via INTRAVENOUS

## 2021-08-08 MED ORDER — SUGAMMADEX SODIUM 200 MG/2ML IV SOLN
INTRAVENOUS | Status: DC | PRN
  Administered 2021-08-08: 100 mg via INTRAVENOUS

## 2021-08-08 MED ORDER — EPHEDRINE 5 MG/ML INJ
INTRAVENOUS | Status: AC
  Filled 2021-08-08: qty 5

## 2021-08-08 MED ORDER — PHENYLEPHRINE HCL (PRESSORS) 10 MG/ML IV SOLN
INTRAVENOUS | Status: DC | PRN
  Administered 2021-08-08 (×4): 100 ug via INTRAVENOUS
  Administered 2021-08-08: 150 ug via INTRAVENOUS
  Administered 2021-08-08: 100 ug via INTRAVENOUS
  Administered 2021-08-08 (×2): 50 ug via INTRAVENOUS
  Administered 2021-08-08 (×2): 100 ug via INTRAVENOUS
  Administered 2021-08-08: 50 ug via INTRAVENOUS

## 2021-08-08 MED ORDER — ROCURONIUM BROMIDE 100 MG/10ML IV SOLN
INTRAVENOUS | Status: DC | PRN
  Administered 2021-08-08: 40 mg via INTRAVENOUS
  Administered 2021-08-08 (×2): 10 mg via INTRAVENOUS
  Administered 2021-08-08: 15 mg via INTRAVENOUS
  Administered 2021-08-08: 10 mg via INTRAVENOUS

## 2021-08-08 MED ORDER — FENTANYL CITRATE (PF) 100 MCG/2ML IJ SOLN
INTRAMUSCULAR | Status: AC
  Filled 2021-08-08: qty 2

## 2021-08-08 MED ORDER — TRANEXAMIC ACID 1000 MG/10ML IV SOLN
INTRAVENOUS | Status: AC
  Filled 2021-08-08: qty 10

## 2021-08-08 MED ORDER — FENTANYL CITRATE (PF) 100 MCG/2ML IJ SOLN: 25.0000 ug | INTRAMUSCULAR | Status: DC | PRN

## 2021-08-08 MED ORDER — DEXMEDETOMIDINE HCL IN NACL 200 MCG/50ML IV SOLN
INTRAVENOUS | Status: AC
  Filled 2021-08-08: qty 50

## 2021-08-08 MED ORDER — BUPIVACAINE-EPINEPHRINE (PF) 0.25% -1:200000 IJ SOLN
INTRAMUSCULAR | Status: DC | PRN
  Administered 2021-08-08: 30 mL via PERINEURAL

## 2021-08-08 MED ORDER — ACETAMINOPHEN 10 MG/ML IV SOLN: 1000.0000 mg | Freq: Once | INTRAVENOUS | Status: DC | PRN

## 2021-08-08 MED ORDER — SODIUM CHLORIDE FLUSH 0.9 % IV SOLN
INTRAVENOUS | Status: AC
  Filled 2021-08-08: qty 10

## 2021-08-08 MED ORDER — ACETAMINOPHEN 10 MG/ML IV SOLN
INTRAVENOUS | Status: AC
  Filled 2021-08-08: qty 100

## 2021-08-08 MED ORDER — FENTANYL CITRATE (PF) 100 MCG/2ML IJ SOLN
INTRAMUSCULAR | Status: DC | PRN
  Administered 2021-08-08: 25 ug via INTRAVENOUS
  Administered 2021-08-08: 50 ug via INTRAVENOUS

## 2021-08-08 MED ORDER — BUPIVACAINE-EPINEPHRINE (PF) 0.25% -1:200000 IJ SOLN
INTRAMUSCULAR | Status: AC
  Filled 2021-08-08: qty 30

## 2021-08-08 MED ORDER — 0.9 % SODIUM CHLORIDE (POUR BTL) OPTIME
TOPICAL | Status: DC | PRN
  Administered 2021-08-08: 1000 mL

## 2021-08-08 MED ORDER — TRANEXAMIC ACID-NACL 1000-0.7 MG/100ML-% IV SOLN
INTRAVENOUS | Status: DC | PRN
  Administered 2021-08-08 (×2): 1000 mg via INTRAVENOUS

## 2021-08-08 MED ORDER — ROCURONIUM BROMIDE 10 MG/ML (PF) SYRINGE
PREFILLED_SYRINGE | INTRAVENOUS | Status: AC
  Filled 2021-08-08: qty 10

## 2021-08-08 MED ORDER — DEXAMETHASONE SODIUM PHOSPHATE 10 MG/ML IJ SOLN
INTRAMUSCULAR | Status: DC | PRN
  Administered 2021-08-08: 10 mg via INTRAVENOUS

## 2021-08-08 MED ORDER — NEOMYCIN-POLYMYXIN B GU 40-200000 IR SOLN
Status: DC | PRN
  Administered 2021-08-08: 16 mL

## 2021-08-08 MED ORDER — OXYCODONE HCL 5 MG/5ML PO SOLN: 5.0000 mg | Freq: Once | ORAL | Status: DC | PRN

## 2021-08-08 MED ORDER — PROMETHAZINE HCL 25 MG/ML IJ SOLN
6.2500 mg | INTRAMUSCULAR | Status: AC | PRN
  Administered 2021-08-08: 6.25 mg via INTRAVENOUS

## 2021-08-08 MED ORDER — PROPOFOL 10 MG/ML IV BOLUS
INTRAVENOUS | Status: DC | PRN
  Administered 2021-08-08: 100 mg via INTRAVENOUS

## 2021-08-08 MED ORDER — SODIUM CHLORIDE 0.9 % IR SOLN
Status: DC | PRN
  Administered 2021-08-08: 3000 mL

## 2021-08-08 SURGICAL SUPPLY — 66 items
BLADE SAGITTAL WIDE XTHICK NO (BLADE) ×2 IMPLANT
BNDG COHESIVE 4X5 TAN ST LF (GAUZE/BANDAGES/DRESSINGS) ×2 IMPLANT
CABLE 1.7 (Orthopedic Implant) ×4 IMPLANT
CEMENT BONE 40GM (Cement) ×4 IMPLANT
CEMENT RESTRICTOR DEPUY SZ 3 (Cement) ×2 IMPLANT
CEMENTRALIZER 9.25MM (Orthopedic Implant) ×2 IMPLANT
COVER BACK TABLE REUSABLE LG (DRAPES) ×2 IMPLANT
DRAPE 3/4 80X56 (DRAPES) ×4 IMPLANT
DRAPE IMP U-DRAPE 54X76 (DRAPES) ×2 IMPLANT
DRAPE INCISE IOBAN 66X45 STRL (DRAPES) ×2 IMPLANT
DRAPE INCISE IOBAN 66X60 STRL (DRAPES) ×2 IMPLANT
DRAPE ORTHO SPLIT 77X108 STRL (DRAPES) ×2
DRAPE POUCH INSTRU U-SHP 10X18 (DRAPES) ×2 IMPLANT
DRAPE SURG 17X11 SM STRL (DRAPES) ×2 IMPLANT
DRAPE SURG ORHT 6 SPLT 77X108 (DRAPES) ×2 IMPLANT
DRSG AQUACEL AG ADV 3.5X10 (GAUZE/BANDAGES/DRESSINGS) ×2 IMPLANT
DRSG OPSITE POSTOP 4X10 (GAUZE/BANDAGES/DRESSINGS) ×2 IMPLANT
DURAPREP 26ML APPLICATOR (WOUND CARE) ×4 IMPLANT
ELECT CAUTERY BLADE 6.4 (BLADE) ×2 IMPLANT
ELECT REM PT RETURN 9FT ADLT (ELECTROSURGICAL) ×2
ELECTRODE REM PT RTRN 9FT ADLT (ELECTROSURGICAL) ×1 IMPLANT
GAUZE 4X4 16PLY ~~LOC~~+RFID DBL (SPONGE) ×2 IMPLANT
GAUZE SPONGE 4X4 12PLY STRL (GAUZE/BANDAGES/DRESSINGS) ×2 IMPLANT
GAUZE XEROFORM 1X8 LF (GAUZE/BANDAGES/DRESSINGS) ×4 IMPLANT
GLOVE SURG ENC MOIS LTX SZ7.5 (GLOVE) ×2 IMPLANT
GLOVE SURG UNDER POLY LF SZ7.5 (GLOVE) ×2 IMPLANT
GOWN STRL REUS W/ TWL LRG LVL3 (GOWN DISPOSABLE) ×1 IMPLANT
GOWN STRL REUS W/TWL LRG LVL3 (GOWN DISPOSABLE) ×1
HEAD FEM UNIPOLAR 43 OD STRL (Hips) ×2 IMPLANT
HOLDER FOLEY CATH W/STRAP (MISCELLANEOUS) ×2 IMPLANT
HOLSTER BOVIE DISP (MISCELLANEOUS) ×2 IMPLANT
HOOD PEEL AWAY FLYTE STAYCOOL (MISCELLANEOUS) ×2 IMPLANT
IV NS IRRIG 3000ML ARTHROMATIC (IV SOLUTION) ×2 IMPLANT
KIT TURNOVER KIT A (KITS) ×2 IMPLANT
MANIFOLD NEPTUNE II (INSTRUMENTS) ×2 IMPLANT
NDL SAFETY ECLIPSE 18X1.5 (NEEDLE) ×1 IMPLANT
NEEDLE FILTER BLUNT 18X 1/2SAF (NEEDLE) ×1
NEEDLE FILTER BLUNT 18X1 1/2 (NEEDLE) ×1 IMPLANT
NEEDLE HYPO 18GX1.5 SHARP (NEEDLE) ×1
NEEDLE MAYO CATGUT SZ4 (NEEDLE) ×2 IMPLANT
NS IRRIG 1000ML POUR BTL (IV SOLUTION) ×2 IMPLANT
PACK HIP PROSTHESIS (MISCELLANEOUS) ×2 IMPLANT
PILLOW ABDUCTION FOAM SM (MISCELLANEOUS) ×2 IMPLANT
PRESSURIZER FEM CANAL M (MISCELLANEOUS) IMPLANT
PULSAVAC PLUS IRRIG FAN TIP (DISPOSABLE) ×2
RETRIEVER SUT HEWSON (MISCELLANEOUS) ×2 IMPLANT
SPACER FEM TAPERED +0 12/14 (Hips) ×2 IMPLANT
SPONGE T-LAP 18X18 ~~LOC~~+RFID (SPONGE) ×8 IMPLANT
STAPLER SKIN PROX 35W (STAPLE) ×2 IMPLANT
STEM CEMENTED SUMMIT SZ2 (Stem) ×2 IMPLANT
SUT ETHIBOND 2 V 37 (SUTURE) ×2 IMPLANT
SUT QUILL PDO 0 36 36 VIOLET (SUTURE) ×2 IMPLANT
SUT TICRON 2-0 30IN 311381 (SUTURE) ×8 IMPLANT
SUT VIC AB 0 CT1 36 (SUTURE) ×2 IMPLANT
SUT VIC AB 2-0 CT1 27 (SUTURE) ×2
SUT VIC AB 2-0 CT1 TAPERPNT 27 (SUTURE) ×2 IMPLANT
SUT VIC AB 2-0 CT2 27 (SUTURE) ×4 IMPLANT
SYR 10ML LL (SYRINGE) ×2 IMPLANT
TAPE MICROFOAM 4IN (TAPE) IMPLANT
TAPE TRANSPORE STRL 2 31045 (GAUZE/BANDAGES/DRESSINGS) ×2 IMPLANT
TIP BRUSH PULSAVAC PLUS 24.33 (MISCELLANEOUS) ×2 IMPLANT
TIP FAN IRRIG PULSAVAC PLUS (DISPOSABLE) ×1 IMPLANT
TOWER CARTRIDGE SMART MIX (DISPOSABLE) IMPLANT
TRAY FOLEY SLVR 16FR LF STAT (SET/KITS/TRAYS/PACK) ×2 IMPLANT
TUBE SUCT KAM VAC (TUBING) ×2 IMPLANT
WATER STERILE IRR 500ML POUR (IV SOLUTION) ×2 IMPLANT

## 2021-08-08 NOTE — Progress Notes (Signed)
The patient has been re-examined, and the chart reviewed, and there have been no interval changes to the documented history and physical/consult note.    The risks, benefits, and alternatives have been discussed at length, and the patient is willing to proceed with surgery.  Ross Marcus 10:04 AM

## 2021-08-08 NOTE — Progress Notes (Signed)
PROGRESS NOTE  Joy Alexander    DOB: July 16, 1946, 75 y.o.  IEP:[id number]  PCP: Lauro Regulus, MD   Code Status: Full Code   DOA: 08/06/2021   LOS: 2  Brief Narrative of Current Hospitalization  Joy Alexander is a 75 y.o. female with a PMH significant for hereditary spastic paraplegia, depression/anxiety. They presented from [facility] to the ED on 08/06/2021 with fall. In the ED, it was found that they had R femoral neck fracture. They were treated with analgesia, antiemetics, IV fluids.  Ortho surgery was consulted.  Patient was admitted to medicine service for further workup and management of chronic conditions as outlined in detail below.  08/08/21 -stable, fixation today  Assessment & Plan  Principal Problem:   Femoral neck fracture (HCC) Active Problems:   Hereditary spastic paraparesis (HCC)   Depression   Fall  R femoral neck fracture- - ortho managing  - surgical fixation 12/10 -Analgesia as needed -Antiemetics. -N.p.o. at midnight -LR maintenance starting once n.p.o. -PT/OT to start postop  Anxiety/depression-chronic, stable -Continue home Lexapro  Hereditary spastic paraplegia-chronic, stable -Continue home primidone -Fall precautions  DVT prophylaxis: SCDs Start: 08/06/21 1518   Diet:  Diet Orders (From admission, onward)     Start     Ordered   08/08/21 0001  Diet NPO time specified  Diet effective midnight        08/07/21 0702            Subjective 08/08/21    Pt reports moderately well controlled pain today. She is looking forward to surgery. No complaints currently.  Disposition Plan & Communication  Patient status: Inpatient  Admitted From: ALF Disposition: Assisted living Anticipated discharge date: 12/11-12, depending on ALF availability to return  Family Communication: None Consults, Procedures, Significant Events  Consultants:  Ortho surgery  Procedures/significant events:  12/10-IRIF right hip Antimicrobials:   Anti-infectives (From admission, onward)    Start     Dose/Rate Route Frequency Ordered Stop   08/08/21 0600  ceFAZolin (ANCEF) IVPB 2g/100 mL premix        2 g 200 mL/hr over 30 Minutes Intravenous On call to O.R. 08/06/21 1757 08/09/21 0559       Objective   Vitals:   08/07/21 1100 08/07/21 1513 08/07/21 2033 08/08/21 0430  BP: (!) 126/59 116/61 121/62 (!) 157/89  Pulse: 72 73 69 70  Resp: 16 16 17 18   Temp: 98.5 F (36.9 C) 98.9 F (37.2 C) 99.4 F (37.4 C) 98.2 F (36.8 C)  TempSrc: Oral   Oral  SpO2:  93% 94% 97%  Weight:      Height:        Intake/Output Summary (Last 24 hours) at 08/08/2021 0605 Last data filed at 08/08/2021 0311 Gross per 24 hour  Intake 780.85 ml  Output 400 ml  Net 380.85 ml    Filed Weights   08/06/21 1308  Weight: 52.4 kg    Patient BMI: Body mass index is 20.46 kg/m.   Physical Exam: General: awake, alert, NAD Respiratory: normal respiratory effort. Cardiovascular: quick capillary refill  Nervous: A&O x3. no gross focal neurologic deficits, normal speech Extremities: decreased motion of right hip. No gross deformity. normal tone Skin: dry, intact, normal temperature, normal color, No rashes, lesions or ulcers on exposed skin Psychiatry: normal mood, congruent affect  Labs   I have personally reviewed following labs and imaging studies Admission on 08/06/2021  Component Date Value Ref Range Status   SARS Coronavirus 2 by RT  PCR 08/06/2021 NEGATIVE  NEGATIVE Final   Influenza A by PCR 08/06/2021 NEGATIVE  NEGATIVE Final   Influenza B by PCR 08/06/2021 NEGATIVE  NEGATIVE Final   Sodium 08/06/2021 137  135 - 145 mmol/L Final   Potassium 08/06/2021 3.9  3.5 - 5.1 mmol/L Final   Chloride 08/06/2021 104  98 - 111 mmol/L Final   CO2 08/06/2021 27  22 - 32 mmol/L Final   Glucose, Bld 08/06/2021 176 (H)  70 - 99 mg/dL Final   BUN 40/98/1191 22  8 - 23 mg/dL Final   Creatinine, Ser 08/06/2021 0.49  0.44 - 1.00 mg/dL Final   Calcium  47/82/9562 8.5 (L)  8.9 - 10.3 mg/dL Final   Total Protein 13/03/6577 6.3 (L)  6.5 - 8.1 g/dL Final   Albumin 46/96/2952 3.8  3.5 - 5.0 g/dL Final   AST 84/13/2440 29  15 - 41 U/L Final   ALT 08/06/2021 28  0 - 44 U/L Final   Alkaline Phosphatase 08/06/2021 67  38 - 126 U/L Final   Total Bilirubin 08/06/2021 0.5  0.3 - 1.2 mg/dL Final   GFR, Estimated 08/06/2021 >60  >60 mL/min Final   Anion gap 08/06/2021 6  5 - 15 Final   Troponin I (High Sensitivity) 08/06/2021 4  <18 ng/L Final   WBC 08/06/2021 5.2  4.0 - 10.5 K/uL Final   RBC 08/06/2021 3.83 (L)  3.87 - 5.11 MIL/uL Final   Hemoglobin 08/06/2021 12.3  12.0 - 15.0 g/dL Final   HCT 06/26/2535 36.8  36.0 - 46.0 % Final   MCV 08/06/2021 96.1  80.0 - 100.0 fL Final   MCH 08/06/2021 32.1  26.0 - 34.0 pg Final   MCHC 08/06/2021 33.4  30.0 - 36.0 g/dL Final   RDW 64/40/3474 12.6  11.5 - 15.5 % Final   Platelets 08/06/2021 161  150 - 400 K/uL Final   nRBC 08/06/2021 0.0  0.0 - 0.2 % Final   Neutrophils Relative % 08/06/2021 71  % Final   Neutro Abs 08/06/2021 3.7  1.7 - 7.7 K/uL Final   Lymphocytes Relative 08/06/2021 17  % Final   Lymphs Abs 08/06/2021 0.9  0.7 - 4.0 K/uL Final   Monocytes Relative 08/06/2021 9  % Final   Monocytes Absolute 08/06/2021 0.5  0.1 - 1.0 K/uL Final   Eosinophils Relative 08/06/2021 1  % Final   Eosinophils Absolute 08/06/2021 0.0  0.0 - 0.5 K/uL Final   Basophils Relative 08/06/2021 1  % Final   Basophils Absolute 08/06/2021 0.1  0.0 - 0.1 K/uL Final   Immature Granulocytes 08/06/2021 1  % Final   Abs Immature Granulocytes 08/06/2021 0.06  0.00 - 0.07 K/uL Final   ABO/RH(D) 08/06/2021 O POS   Final   Antibody Screen 08/06/2021 NEG   Final   Sample Expiration 08/06/2021    Final                   Value:08/09/2021,2359 Performed at [facility], [address] [place], [place] [address]    Troponin I (High Sensitivity) 08/06/2021 12  <18 ng/L Final   Glucose-Capillary 08/06/2021 199 (H)  70 -  99 mg/dL Final   Glucose-Capillary 08/07/2021 134 (H)  70 - 99 mg/dL Final   Comment 1 38/75/6433 Notify RN   Final   Comment 2 08/07/2021 Document in Chart   Final   MRSA, PCR 08/07/2021 NEGATIVE  NEGATIVE Final   Staphylococcus aureus 08/07/2021 POSITIVE (A)  NEGATIVE Final  Glucose-Capillary 08/07/2021 149 (H)  70 - 99 mg/dL Final   Comment 1 76/22/6333 Notify RN   Final   Comment 2 08/07/2021 Document in Chart   Final   Sodium 08/08/2021 141  135 - 145 mmol/L Final   Potassium 08/08/2021 4.0  3.5 - 5.1 mmol/L Final   Chloride 08/08/2021 102  98 - 111 mmol/L Final   CO2 08/08/2021 33 (H)  22 - 32 mmol/L Final   Glucose, Bld 08/08/2021 123 (H)  70 - 99 mg/dL Final   BUN 54/56/2563 15  8 - 23 mg/dL Final   Creatinine, Ser 08/08/2021 0.55  0.44 - 1.00 mg/dL Final   Calcium 89/37/3428 8.9  8.9 - 10.3 mg/dL Final   GFR, Estimated 08/08/2021 >60  >60 mL/min Final   Anion gap 08/08/2021 6  5 - 15 Final   WBC 08/08/2021 4.2  4.0 - 10.5 K/uL Final   RBC 08/08/2021 4.08  3.87 - 5.11 MIL/uL Final   Hemoglobin 08/08/2021 12.7  12.0 - 15.0 g/dL Final   HCT 76/81/1572 39.3  36.0 - 46.0 % Final   MCV 08/08/2021 96.3  80.0 - 100.0 fL Final   MCH 08/08/2021 31.1  26.0 - 34.0 pg Final   MCHC 08/08/2021 32.3  30.0 - 36.0 g/dL Final   RDW 62/10/5595 12.4  11.5 - 15.5 % Final   Platelets 08/08/2021 125 (L)  150 - 400 K/uL Final   nRBC 08/08/2021 0.0  0.0 - 0.2 % Final    Imaging Studies  No results found. Medications   Scheduled Meds:  acetaminophen  325 mg Oral Q6H   Chlorhexidine Gluconate Cloth  6 each Topical Daily   cholecalciferol  2,000 Units Oral Daily   escitalopram  20 mg Oral Daily   feeding supplement (GLUCERNA SHAKE)  237 mL Oral TID BM   gabapentin  600 mg Oral QHS   multivitamin with minerals  1 tablet Oral Daily   mupirocin ointment  1 application Nasal BID   polyethylene glycol  17 g Oral Daily   primidone  250 mg Oral BID   simvastatin  10 mg Oral QHS   No recently  discontinued medications to reconcile  LOS: 2 days   Time spent: >6min  Leeroy Bock, DO [facility] 08/08/2021, 6:05 AM   Please refer to amion to contact the [facility] Attending or Consulting provider for this pt  [email or web address] Available by Epic secure chat 7AM-7PM. If 7PM-7AM, please contact night-coverage

## 2021-08-08 NOTE — Transfer of Care (Signed)
Immediate Anesthesia Transfer of Care Note  Patient: Joy Alexander  Procedure(s) Performed: ARTHROPLASTY BIPOLAR HIP (HEMIARTHROPLASTY) (Right: Hip)  Patient Location: PACU  Anesthesia Type:General  Level of Consciousness: awake, alert  and oriented  Airway & Oxygen Therapy: Patient Spontanous Breathing and Patient connected to nasal cannula oxygen  Post-op Assessment: Report given to RN and Post -op Vital signs reviewed and stable  Post vital signs: Reviewed and stable  Last Vitals:  Vitals Value Taken Time  BP 111/71 08/08/21 1345  Temp 36.1 C 08/08/21 1345  Pulse 75 08/08/21 1351  Resp 15 08/08/21 1351  SpO2 100 % 08/08/21 1351  Vitals shown include unvalidated device data.  Last Pain:  Vitals:   08/08/21 1345  TempSrc: Temporal  PainSc:          Complications: No notable events documented.

## 2021-08-08 NOTE — Anesthesia Procedure Notes (Addendum)
Procedure Name: Intubation Date/Time: 08/08/2021 11:05 AM Performed by: Estanislado Emms, CRNA Pre-anesthesia Checklist: Patient identified, Emergency Drugs available, Suction available and Patient being monitored Patient Re-evaluated:Patient Re-evaluated prior to induction Oxygen Delivery Method: Circle system utilized Preoxygenation: Pre-oxygenation with 100% oxygen Induction Type: IV induction Ventilation: Mask ventilation without difficulty Laryngoscope Size: McGraph and 3 Grade View: Grade I Tube type: Oral Tube size: 6.5 mm Number of attempts: 1 Airway Equipment and Method: Stylet Placement Confirmation: ETT inserted through vocal cords under direct vision, positive ETCO2 and breath sounds checked- equal and bilateral Secured at: 19 cm Tube secured with: Tape Dental Injury: Teeth and Oropharynx as per pre-operative assessment

## 2021-08-08 NOTE — Progress Notes (Signed)
OT Cancellation Note  Patient Details Name: Joy Alexander MRN: [id number] DOB: September 15, 1945   Cancelled Treatment:     OT order received, pt in surgery this date for repair after right femoral neck fracture.  Will plan to evaluate patient next date.    Kathlyne Loud T Callan Yontz, OTR/L, CLT   Joy Alexander 08/08/2021, 11:07 AM

## 2021-08-08 NOTE — Op Note (Signed)
08/08/2021  1:59 PM  PATIENT:  Joy Alexander   MRN: [id number]  PRE-OPERATIVE DIAGNOSIS:  right femoral neck fracture  POST-OPERATIVE DIAGNOSIS:  right femoral neck fracture  PROCEDURE:  Procedure(s): ARTHROPLASTY BIPOLAR HIP (HEMIARTHROPLASTY)  PREOPERATIVE INDICATIONS:    Joy Alexander is an 75 y.o. female who was admitted with a diagnosis of right hip fracture.  I have recommended surgical fixation with hemiarthroplasty for this injury. I have explained the surgery and the postoperative course to the patient who has agreed with surgical management of this fracture.    The risks benefits and alternatives were discussed with the patient and their family including but not limited to the risks of  infection requiring removal of the prosthesis, bleeding requiring blood transfusion, nerve injury especially to the sciatic nerve leading to foot drop or lower extremity numbness, periprosthetic fracture, dislocation leg length discrepancy, change in lower extremity rotation persistent hip pain, loosening or failure of the components and the need for revision surgery. Medical risks include but are not limited to DVT and pulmonary embolism, myocardial infarction, stroke, pneumonia, respiratory failure and death.  OPERATIVE REPORT     SURGEON: Renee Harder     ANESTHESIA: Normal    COMPLICATIONS:  None.   SPECIMEN: Femoral head to pathology    COMPONENTS: DePuy size 2 cemented stem, 43 mm head    PROCEDURE IN DETAIL:   The patient was met in the holding area and  identified.  The appropriate hip was identified and marked at the operative site after verbally confirming with the patient that this was the correct site of surgery.  The patient was then transported to the OR  and  underwent general anesthesia.  The patient was then placed in the lateral decubitus position with the operative side up and secured on the operating room table with a pegboard and all bony  prominences were adequately padded. This included an axillary roll and additional padding around the nonoperative leg to prevent compression to the common peroneal nerve.    The operative lower extremity was prepped and draped in a sterile fashion.  A time out was performed prior to incision to verify patient's name, date of birth, medical record number, correct site of surgery correct procedure to be performed. The timeout was also used to verify the patient received antibiotics now appropriate instruments, implants and radiographic studies were available in the room. Once all in attendance were in agreement case began.    A posterolateral approach was utilized via sharp dissection  carried down to the subcutaneous tissue.  Bleeding vessels were coagulated using electrocautery.  The fascia lata was identified and incised along the length of the skin incision.  The gluteus maximus muscle was then split in line with its fibers. Self-retaining retractors were  inserted.  With the hip internally rotated, the short external rotators  were identified and removed from the posterior attachment from the greater trochanter.The capsule was identified and a T-shaped capsulotomy was performed. The capsule was tagged with #2 Tycron for later repair.  The femoral neck fracture was exposed, and the femoral head was removed using a corkscrew device. This was measured to be 43 mm in diameter. The attention was then turned to proximal femur preparation.  An oscillating saw was used to perform a proximal femoral osteotomy 1 fingerbreadth above the lesser trochanter. The trial 43 mm femoral head was placed into the acetabulum and had an excellent suction fit. The attention was then turned back to femoral preparation.  A femoral skid and Cobra retractor were placed under the femoral neck to allow for adequate visualization. A box osteotome was used to make the initial entry into the proximal femur. A single hand reamer was  used to prepare the femoral canal. A T-shaped femoral canal sounder was then used to ensure no penetration femoral cortex had occurred during reaming. The proximal femur was then sequentially broached by hand. A size 2 femoral trial broach was found to have best medial to lateral canal fit. Once adequate mediolateral canal fill was achieved the trial femoral broach, neck, and head was assembled and the hip was reduced. It was found to have excellent stability, equivalent leg lengths with functional range of motion. The trial components were then removed.  It was noted at this time that there was a fracture extending along the anterior cortex approximately 2 to 3 cm in length.  This was nondisplaced.  Decision was made to place to Dall-Miles cables.  These were passed just inferior to the lesser tuberosity with 40 kg attention.  I copiously irrigated the femoral canal and then cemented the femoral stem in standard fashion.  Size 0 neck and 43 mm head were then mated to the stem.  The hip was then reduced and taken through functional range of motion and found to have excellent stability. Leg lengths were restored. The hip joint was copiously irrigated.   A soft tissue repair of the capsule and external rotators was performed using #2 Tycron. Excellent posterior capsular repair was achieved. The fascia lata was then closed with interrupted 0 Vicryl suture and 0 Quill. The subcutaneous tissues were closed with 2-0 Vicryl and the skin approximated with staples.   The patient was then placed supine on the operative table. Leg lengths were checked clinically and found to be equivalent. An abduction pillow was placed between the lower extremities. The patient was then transferred to a hospital bed and brought to the PACU in stable condition. I was scrubbed and present the entire case and all sharp and instrument counts were correct at the conclusion of the case.   Postop plan: Patient will be partial  weightbearing on the right lower extremity for the next 4 weeks.  Posterior hip precautions will be utilized for 3 months.  Patient will likely discharge back to [facility] once cleared from medical perspective.  She will follow-up in clinic in 2 weeks for staple removal and x-rays.   Renee Harder Orthopedic Surgeon

## 2021-08-08 NOTE — Anesthesia Postprocedure Evaluation (Signed)
Anesthesia Post Note  Patient: Joy Alexander  Procedure(s) Performed: ARTHROPLASTY BIPOLAR HIP (HEMIARTHROPLASTY) (Right: Hip)  Patient location during evaluation: PACU Anesthesia Type: General Level of consciousness: awake and alert Pain management: pain level controlled Vital Signs Assessment: post-procedure vital signs reviewed and stable Respiratory status: spontaneous breathing, nonlabored ventilation and respiratory function stable Cardiovascular status: blood pressure returned to baseline and stable Postop Assessment: no apparent nausea or vomiting Anesthetic complications: no   No notable events documented.   Last Vitals:  Vitals:   08/08/21 1438 08/08/21 1445  BP:  (!) 144/57  Pulse: 66 64  Resp: 15 (!) 32  Temp:  (!) 36.1 C  SpO2: 100% 100%    Last Pain:  Vitals:   08/08/21 1445  TempSrc:   PainSc: 0-No pain                 Foye Deer

## 2021-08-09 DIAGNOSIS — F3289 Other specified depressive episodes: Secondary | ICD-10-CM

## 2021-08-09 MED ORDER — SENNA 8.6 MG PO TABS
1.0000 | ORAL_TABLET | Freq: Every day | ORAL | Status: DC
  Administered 2021-08-09 – 2021-08-10 (×2): 8.6 mg via ORAL
  Filled 2021-08-09 (×2): qty 1

## 2021-08-09 MED ORDER — LACTATED RINGERS IV BOLUS
1000.0000 mL | Freq: Once | INTRAVENOUS | Status: AC
  Administered 2021-08-09: 1000 mL via INTRAVENOUS

## 2021-08-09 MED ORDER — BISACODYL 5 MG PO TBEC
5.0000 mg | DELAYED_RELEASE_TABLET | Freq: Every day | ORAL | Status: DC | PRN
  Administered 2021-08-09: 5 mg via ORAL
  Filled 2021-08-09: qty 1

## 2021-08-09 MED ORDER — POLYETHYLENE GLYCOL 3350 17 G PO PACK
17.0000 g | PACK | Freq: Two times a day (BID) | ORAL | Status: DC
  Administered 2021-08-09 – 2021-08-10 (×2): 17 g via ORAL
  Filled 2021-08-09 (×2): qty 1

## 2021-08-09 MED ORDER — BIOTENE DRY MOUTH MT LIQD: 15.0000 mL | OROMUCOSAL | Status: DC | PRN

## 2021-08-09 NOTE — Evaluation (Signed)
Physical Therapy Evaluation Patient Details Name: Joy Alexander MRN: [id number] DOB: [birth year] Today's Date: 08/09/2021  History of Present Illness  Joy Alexander is a 75 y.o. 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 X-ray shows acute impacted right subcapital femoral neck fracture. Ortho consulted. Agreed to have right hip hemiarthroplasty pending medical clearance. Scheduled for 12/10 AM   Clinical Impression  Pt received in supine position and agreeable to therapy.  Husband present in room throughout session.  Pt currently resides at [facility] with husband and is familiar with rehab team at [facility].  Pt very motivated for therapy, however notes that she attempted to stand earlier in AM and experienced nausea, warmth, and weakness.  Pt performed bed-level exercises with good technique, still demonstrating significant weakness and pain with movement, but is able to come to EOB in sitting.  Pt notes nausea and dizziness, in which resolved before attempting to stand.  Pt then noted the dizziness and nausea becoming too great and requested to sit down.  Vitals were taken by nurse tech and found to be 73/39 after sitting from standing position.  Nursing in room an noted the vitals and would be informing MD.  Pt returned to supine position with maxA from therapist and +2 for placement in bed.  Pt left with husband and nursing in room and all needs met.  Pt will benefit from skilled PT intervention to increase independence and safety with basic mobility in preparation for discharge to the venue listed below.           Recommendations for follow up therapy are one component of a multi-disciplinary discharge planning process, led by the attending physician.  Recommendations may be updated based on patient status, additional functional  criteria and insurance authorization.  Follow Up Recommendations Skilled nursing-short term rehab (<3 hours/day)    Assistance Recommended at Discharge Frequent or constant Supervision/Assistance  Functional Status Assessment Patient has had a recent decline in their functional status and demonstrates the ability to make significant improvements in function in a reasonable and predictable amount of time.  Equipment Recommendations  None recommended by PT    Recommendations for Other Services       Precautions / Restrictions Precautions Precautions: Posterior Hip Precaution Booklet Issued: Yes (comment) Restrictions Weight Bearing Restrictions: Yes RLE Weight Bearing: Partial weight bearing      Mobility  Bed Mobility                    Transfers                        Ambulation/Gait                  Stairs            Wheelchair Mobility    Modified Rankin (Stroke Patients Only)       Balance Overall balance assessment: Needs assistance Sitting-balance support: No upper extremity supported Sitting balance-Leahy Scale: Good   Postural control: Posterior lean (d/t surgical procedure.) Standing balance support: Bilateral upper extremity supported Standing balance-Leahy Scale: Fair                               Pertinent Vitals/Pain Pain Assessment: 0-10 Pain Score: 7  Pain Location: R Knee/Thigh Pain  Descriptors / Indicators: Aching Pain Intervention(s): Limited activity within patient's tolerance;Monitored during session;Premedicated before session;Repositioned    Home Living Family/patient expects to be discharged to:: Private residence Living Arrangements: Spouse/significant other Available Help at Discharge: Family Type of Home: House Home Access: Level entry       Home Layout: One level Home Equipment: Rollator (4 wheels);Cane - single point;Shower seat;Grab bars - toilet;Grab bars - tub/shower;Hand held  shower head      Prior Function Prior Level of Function : Independent/Modified Independent;Needs assist       Physical Assist : ADLs (physical)   ADLs (physical): IADLs (Laundry, Dishes) Mobility Comments: Pt notes she furniture cruises, and utilizes rollator more often than walking stick.       Hand Dominance   Dominant Hand: Right    Extremity/Trunk Assessment        Lower Extremity Assessment Lower Extremity Assessment: Generalized weakness;RLE deficits/detail RLE Deficits / Details: PWB due to surgical procedure       Communication   Communication: No difficulties  Cognition Arousal/Alertness: Awake/alert Behavior During Therapy: WFL for tasks assessed/performed Overall Cognitive Status: Within Functional Limits for tasks assessed                                          General Comments General comments (skin integrity, edema, etc.): pt with significant drop in BP upon standing/sitting upright.    Exercises Total Joint Exercises Ankle Circles/Pumps: AROM;Strengthening;Both;20 reps;Supine Quad Sets: AROM;Strengthening;Both;10 reps;Supine Gluteal Sets: AROM;Strengthening;Both;10 reps;Supine Towel Squeeze: AROM;Strengthening;Both;10 reps;Supine Heel Slides: AROM;Strengthening;Both;10 reps;Supine Hip ABduction/ADduction: AROM;Strengthening;Both;10 reps;Supine Straight Leg Raises: AROM;Left;AAROM;Right;Strengthening;10 reps;Supine Other Exercises Other Exercises: Pt and husband educated on roles of Pt and services provided during hospital stay.  Pt encouraged and educated on the importance of exercises throughout the day to prevent muscle atrophy.   Assessment/Plan    PT Assessment Patient needs continued PT services  PT Problem List Decreased strength;Decreased activity tolerance;Decreased balance;Decreased mobility;Decreased knowledge of use of DME;Pain       PT Treatment Interventions DME instruction;Gait training;Functional mobility  training;Therapeutic activities;Therapeutic exercise;Balance training;Neuromuscular re-education    PT Goals (Current goals can be found in the Care Plan section)  Acute Rehab PT Goals Patient Stated Goal: to go back to [facility]. PT Goal Formulation: With patient Time For Goal Achievement: 08/23/21 Potential to Achieve Goals: Good    Frequency BID   Barriers to discharge   Pt would benefit from SNF due to not being able to ambulate at this time and medical condition (decreased BP) preventing her from standing.    Co-evaluation               AM-PAC PT "6 Clicks" Mobility  Outcome Measure Help needed turning from your back to your side while in a flat bed without using bedrails?: A Lot Help needed moving from lying on your back to sitting on the side of a flat bed without using bedrails?: A Lot Help needed moving to and from a bed to a chair (including a wheelchair)?: A Lot Help needed standing up from a chair using your arms (e.g., wheelchair or bedside chair)?: A Lot Help needed to walk in hospital room?: Total Help needed climbing 3-5 steps with a railing? : Total 6 Click Score: 10    End of Session Equipment Utilized During Treatment: Gait belt Activity Tolerance: Treatment limited secondary to medical complications (Comment) Patient left: in bed;with  call bell/phone within reach;with bed alarm set;with family/visitor present Nurse Communication: Mobility status PT Visit Diagnosis: Unsteadiness on feet (R26.81);Other abnormalities of gait and mobility (R26.89);Muscle weakness (generalized) (M62.81);History of falling (Z91.81);Difficulty in walking, not elsewhere classified (R26.2);Pain Pain - Right/Left: Right Pain - part of body: Hip    Time: 2755-6239 PT Time Calculation (min) (ACUTE ONLY): 61 min   Charges:   PT Evaluation $PT Eval Low Complexity: 1 Low PT Treatments $Therapeutic Exercise: 23-37 mins $Therapeutic Activity: 23-37 mins        Gwenlyn Saran, PT, DPT 08/09/21, 12:30 PM   Christie Nottingham 08/09/2021, 12:24 PM

## 2021-08-09 NOTE — Evaluation (Signed)
Occupational Therapy Evaluation Patient Details Name: Joy Alexander MRN: [id number] DOB: [birth year] Today's Date: 08/09/2021   History of Present Illness 75 y.o. 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 X-ray shows acute impacted right subcapital femoral neck fracture. Now s/p right hip hemiarthroplasty (12/10)   Clinical Impression   Pt seen for OT evaluation this date, POD#1 from above surgery. Prior to admission, pt was independent in all ADLs, occasionally using rollator for functional mobility, and living in a 1-level home with husband. Pt was able to recall 2/3 posterior hip precautions at start of session, however was unable to verbalize how to implement precautions during ADLs and functional mobility. Pt re-educated in all posterior hip precautions and instructed on how to implement during self care tasks; handout provided and pt verbalized understanding.   Pt currently requires MOD A for bed mobility d/t pain and difficulty sequencing transfer while adhering to posterior hip precautions. Once seated EOB, pt reported dizziness. This author attempted to obtain orthostatics (see flowsheet) however unable to obtain standing vitals d/t pt reporting urgent need to use BSC once standing. Pt required MOD A to transfer to [facility] via stand pivot transfer with RW. Once seated on BSC, pt reporting "feeling faint" and reporting that symptoms were not resolving following seated LB exercises. Pt required MAX A+2 for transfer BSC>bed. RN informed of all symptoms and pt left supine in bed on bedpan, with BP & HR returning to resting rates. Pt would benefit from additional instruction in self care skills and techniques to help maintain precautions, with or without assistive devices, to support recall and carryover prior to discharge. Recommend SNF upon  discharge.        Recommendations for follow up therapy are one component of a multi-disciplinary discharge planning process, led by the attending physician.  Recommendations may be updated based on patient status, additional functional criteria and insurance authorization.   Follow Up Recommendations  Skilled nursing-short term rehab (<3 hours/day)    Assistance Recommended at Discharge Frequent or constant Supervision/Assistance  Functional Status Assessment  Patient has had a recent decline in their functional status and demonstrates the ability to make significant improvements in function in a reasonable and predictable amount of time.  Equipment Recommendations  Other (comment) (defer to next venue of care)       Precautions / Restrictions Precautions Precautions: Posterior Hip Restrictions Weight Bearing Restrictions: Yes RLE Weight Bearing: Weight bearing as tolerated      Mobility Bed Mobility Overal bed mobility: Needs Assistance Bed Mobility: Supine to Sit;Sit to Supine     Supine to sit: Mod assist Sit to supine: Mod assist        Transfers Overall transfer level: Needs assistance   Transfers: Sit to/from Stand;Bed to chair/wheelchair/BSC Sit to Stand: Mod assist Stand pivot transfers: Max assist;+2 physical assistance         General transfer comment: Pt required MOD A for stand pivot transfer bed>BSC, however required MAX A+2 BSC>bed d/t worsening dizziness      Balance Overall balance assessment: Needs assistance Sitting-balance support: No upper extremity supported;Feet supported Sitting balance-Leahy Scale: Fair Sitting balance - Comments: Requires supervision for static sitting balance at EOB   Standing balance support: Bilateral upper extremity supported;During functional activity Standing balance-Leahy Scale: Poor Standing balance comment: Requires MOD A for static standing balance  ADL either performed  or assessed with clinical judgement   ADL Overall ADL's : Needs assistance/impaired                                       General ADL Comments: Requires SUPERVISION for seated UB ADLs at EOB d/t pt reporting dizziness following supine>sit transfer. Requires MAX A for LB dressing without AD d/t posterior hip precautions. Pt required MOD A for stand pivot transfer bed>BSC, however required MAX A+2 fto BSC>bed d/t worsening dizziness     Vision Baseline Vision/History: 1 Wears glasses Ability to See in Adequate Light: 0 Adequate Patient Visual Report: No change from baseline              Pertinent Vitals/Pain Pain Assessment: 0-10 Pain Score: 6  Pain Descriptors / Indicators: Aching;Shooting Pain Intervention(s): Limited activity within patient's tolerance;Monitored during session;Repositioned     Hand Dominance Right   Extremity/Trunk Assessment Upper Extremity Assessment Upper Extremity Assessment: Overall WFL for tasks assessed   Lower Extremity Assessment Lower Extremity Assessment: Generalized weakness;RLE deficits/detail RLE Deficits / Details: s/p hip hemiarthroplasty       Communication Communication Communication: No difficulties   Cognition Arousal/Alertness: Awake/alert Behavior During Therapy: WFL for tasks assessed/performed Overall Cognitive Status: Within Functional Limits for tasks assessed                                       General Comments  Pt reporting dizziness when sitting upright. This author attempted to obtain orthostatic vitals this date however unable to obtain vitals while in standing d/t pt reporting urgent need to use [facility]    Exercises Other Exercises Other Exercises: Pt re-educated in all posterior hip precautions and instructed on how to implement during self care tasks; handout provided and pt verbalized understanding.    Home Living Family/patient expects to be discharged to:: Private residence Living  Arrangements: Spouse/significant other Available Help at Discharge: Family Type of Home: House Home Access: Level entry     Home Layout: One level     Bathroom Shower/Tub: Producer, television/film/video: Handicapped height Bathroom Accessibility: Yes   Home Equipment: Rollator (4 wheels);Cane - single point;Shower seat;Grab bars - toilet;Grab bars - tub/shower;Hand held shower head          Prior Functioning/Environment Prior Level of Function : Independent/Modified Independent;Needs assist       Physical Assist : ADLs (physical)   ADLs (physical): IADLs (Laundry, Dishes) Mobility Comments: Pt notes she furniture cruises, and utilizes rollator more often than walking stick. ADLs Comments: Pt reports that she is independent with ADLs. Receives assist for IADLs        OT Problem List: Decreased strength;Decreased range of motion;Decreased activity tolerance;Impaired balance (sitting and/or standing);Decreased knowledge of use of DME or AE;Decreased knowledge of precautions;Pain      OT Treatment/Interventions: Self-care/ADL training;Therapeutic exercise;Energy conservation;DME and/or AE instruction;Therapeutic activities;Patient/family education;Balance training    OT Goals(Current goals can be found in the care plan section) Acute Rehab OT Goals Patient Stated Goal: to be able to use [facility] OT Goal Formulation: With patient Time For Goal Achievement: 08/23/21 Potential to Achieve Goals: Good ADL Goals Pt Will Perform Grooming: with min assist;standing Pt Will Perform Lower Body Dressing: with min assist;with adaptive equipment;sit to/from stand Pt Will Transfer to Toilet: with min  assist;stand pivot transfer;bedside commode  OT Frequency: Min 2X/week    AM-PAC OT "6 Clicks" Daily Activity     Outcome Measure Help from another person eating meals?: None Help from another person taking care of personal grooming?: A Little Help from another person toileting, which  includes using toliet, bedpan, or urinal?: A Lot Help from another person bathing (including washing, rinsing, drying)?: A Lot Help from another person to put on and taking off regular upper body clothing?: A Little Help from another person to put on and taking off regular lower body clothing?: A Lot 6 Click Score: 16   End of Session Equipment Utilized During Treatment: Rolling walker (2 wheels) Nurse Communication: Mobility status;Other (comment) (pt reporting dizziness with change in positioning)  Activity Tolerance: Patient tolerated treatment well Patient left: in bed;with call bell/phone within reach;with bed alarm set  OT Visit Diagnosis: Unsteadiness on feet (R26.81);Pain Pain - Right/Left: Right Pain - part of body: Hip                Time: 0569-7948 OT Time Calculation (min): 36 min Charges:  OT General Charges $OT Visit: 1 Visit OT Evaluation $OT Eval Moderate Complexity: 1 Mod OT Treatments $Self Care/Home Management : 23-37 mins  Matthew Folks, OTR/L [facility] [phone]

## 2021-08-09 NOTE — [place] FL2 (Signed)
[place] MEDICAID FL2 LEVEL OF CARE SCREENING TOOL     IDENTIFICATION  Patient Name: Joy Alexander Birthdate: [birth year] Sex: female Admission Date (Current Location): 08/06/2021  [place] and [place] Number:  Chiropodist and Address:  [facility], [address], [place], [place] [address]      Provider Number: [id number]  Attending Physician Name and Address:  Leeroy Bock, MD  Relative Name and Phone Number:  Coral, Timme (Spouse)   [phone] [facility] Phone)    Current Level of Care: Hospital Recommended Level of Care: Skilled Nursing Facility Prior Approval Number:    Date Approved/Denied:   PASRR Number: [id number]  Discharge Plan:      Current Diagnoses: Patient Active Problem List   Diagnosis Date Noted   Fall    Femoral neck fracture (HCC) 08/06/2021   Hereditary spastic paraparesis (HCC)    Depression     Orientation RESPIRATION BLADDER Height & Weight     Self, Time, Situation, Place  Normal Continent Weight: 115 lb 8 oz (52.4 kg) Height:  5\' 3"  (160 cm)  BEHAVIORAL SYMPTOMS/MOOD NEUROLOGICAL BOWEL NUTRITION STATUS      Continent Diet (carb modified)  AMBULATORY STATUS COMMUNICATION OF NEEDS Skin   Extensive Assist Verbally Surgical wounds (right hip)                       Personal Care Assistance Level of Assistance  Bathing, Feeding, Dressing Bathing Assistance: Maximum assistance Feeding assistance: Limited assistance Dressing Assistance: Maximum assistance     Functional Limitations Info             SPECIAL CARE FACTORS FREQUENCY  PT (By licensed PT), OT (By licensed OT)     PT Frequency: 5 x per week OT Frequency: 5 x per week            Contractures      Additional Factors Info  Code Status, Allergies Code Status Info: full Allergies Info: midazolam, mobic (meloxicam), tramadol           Current Medications (08/09/2021):  This is the current hospital active  medication list Current Facility-Administered Medications  Medication Dose Route Frequency Provider Last Rate Last Admin   acetaminophen (TYLENOL) tablet 325 mg  325 mg Oral Q6H 14/06/2021, MD   325 mg at 08/09/21 1229   Chlorhexidine Gluconate Cloth 2 % PADS 6 each  6 each Topical Daily 14/11/22, MD   6 each at 08/09/21 0844   cholecalciferol (VITAMIN D) tablet 2,000 Units  2,000 Units Oral Daily 14/11/22, MD   2,000 Units at 08/09/21 0843   enoxaparin (LOVENOX) injection 40 mg  40 mg Subcutaneous Q24H 14/11/22, MD   40 mg at 08/09/21 0843   escitalopram (LEXAPRO) tablet 20 mg  20 mg Oral Daily 14/11/22, MD   20 mg at 08/09/21 0843   feeding supplement (GLUCERNA SHAKE) (GLUCERNA SHAKE) liquid 237 mL  237 mL Oral TID BM 14/11/22, MD   237 mL at 08/09/21 1231   gabapentin (NEURONTIN) capsule 600 mg  600 mg Oral 14/11/22, MD   600 mg at 08/08/21 2059   HYDROcodone-acetaminophen (NORCO/VICODIN) 5-325 MG per tablet 1-2 tablet  1-2 tablet Oral Q6H PRN 2060, MD   2 tablet at 08/09/21 0843   HYDROmorphone (DILAUDID) injection 0.5 mg  0.5 mg Intravenous Q12H PRN 14/11/22, MD   0.5 mg at 08/08/21 1522   methocarbamol (ROBAXIN) tablet 500  mg  500 mg Oral Q6H PRN Ross Marcus, MD       Or   methocarbamol (ROBAXIN) 500 mg in dextrose 5 % 50 mL IVPB  500 mg Intravenous Q6H PRN Ross Marcus, MD       multivitamin with minerals tablet 1 tablet  1 tablet Oral Daily Ross Marcus, MD   1 tablet at 08/09/21 0843   mupirocin ointment (BACTROBAN) 2 % 1 application  1 application Nasal BID Ross Marcus, MD   1 application at 08/09/21 0844   ondansetron [facility]) injection 4 mg  4 mg Intravenous Q6H PRN Ross Marcus, MD   4 mg at 08/08/21 1107   polyethylene glycol (MIRALAX / GLYCOLAX) packet 17 g  17 g Oral BID Leeroy Bock, MD       primidone (MYSOLINE) tablet 250 mg  250 mg Oral BID Ross Marcus,  MD   250 mg at 08/09/21 0843   promethazine (PHENERGAN) 12.5 mg in sodium chloride 0.9 % 50 mL IVPB  12.5 mg Intravenous Q6H PRN Ross Marcus, MD   Stopped at 08/07/21 2218   senna (SENOKOT) tablet 8.6 mg  1 tablet Oral Daily Jamelle Rushing L, MD   8.6 mg at 08/09/21 1231   simvastatin (ZOCOR) tablet 10 mg  10 mg Oral Eugenie Norrie, MD   10 mg at 08/08/21 2100     Discharge Medications: Please see discharge summary for a list of discharge medications.  Relevant Imaging Results:  Relevant Lab Results:   Additional Information SS #: [id number]  Larkin Morelos E Trayshawn Durkin, LCSW

## 2021-08-09 NOTE — Plan of Care (Signed)
  Problem: Education: Goal: Knowledge of General Education information will improve Description: Including pain rating scale, medication(s)/side effects and non-pharmacologic comfort measures Outcome: Progressing   Problem: Health Behavior/Discharge Planning: Goal: Ability to manage health-related needs will improve Outcome: Progressing   Problem: Clinical Measurements: Goal: Ability to maintain clinical measurements within normal limits will improve Outcome: Progressing Goal: Will remain free from infection Outcome: Progressing Goal: Diagnostic test results will improve Outcome: Progressing Goal: Respiratory complications will improve Outcome: Progressing Goal: Cardiovascular complication will be avoided Outcome: Progressing   Problem: Activity: Goal: Risk for activity intolerance will decrease Outcome: Progressing   Problem: Nutrition: Goal: Adequate nutrition will be maintained Outcome: Progressing   Problem: Coping: Goal: Level of anxiety will decrease Outcome: Progressing   Problem: Elimination: Goal: Will not experience complications related to bowel motility Outcome: Progressing Goal: Will not experience complications related to urinary retention Outcome: Progressing   Problem: Pain Managment: Goal: General experience of comfort will improve Outcome: Progressing   Problem: Safety: Goal: Ability to remain free from injury will improve Outcome: Progressing   Problem: Skin Integrity: Goal: Risk for impaired skin integrity will decrease Outcome: Progressing   Problem: Education: Goal: Verbalization of understanding the information provided (i.e., activity precautions, restrictions, etc) will improve Outcome: Progressing Goal: Individualized Educational Video(s) Outcome: Progressing   Problem: Activity: Goal: Ability to ambulate and perform ADLs will improve Outcome: Progressing   Problem: Clinical Measurements: Goal: Postoperative complications will be  avoided or minimized Outcome: Progressing   Problem: Self-Concept: Goal: Ability to maintain and perform role responsibilities to the fullest extent possible will improve Outcome: Progressing   Problem: Pain Management: Goal: Pain level will decrease Outcome: Progressing   Problem: Education: Goal: Knowledge of the prescribed therapeutic regimen will improve Outcome: Progressing Goal: Understanding of discharge needs will improve Outcome: Progressing Goal: Individualized Educational Video(s) Outcome: Progressing   Problem: Activity: Goal: Ability to avoid complications of mobility impairment will improve Outcome: Progressing Goal: Ability to tolerate increased activity will improve Outcome: Progressing   Problem: Clinical Measurements: Goal: Postoperative complications will be avoided or minimized Outcome: Progressing   Problem: Pain Management: Goal: Pain level will decrease with appropriate interventions Outcome: Progressing   Problem: Skin Integrity: Goal: Will show signs of wound healing Outcome: Progressing   

## 2021-08-09 NOTE — Progress Notes (Signed)
   08/09/21 1250  Assess: MEWS Score  BP 140/67  Pulse Rate 90  Assess: MEWS Score  MEWS Temp 0  MEWS Systolic 0  MEWS Pulse 0  MEWS RR 0  MEWS LOC 0  MEWS Score 0  MEWS Score Color Green  Assess: if the MEWS score is Yellow or Red  Were vital signs taken at a resting state? Yes  Focused Assessment No change from prior assessment  Does the patient meet 2 or more of the SIRS criteria? No  MEWS guidelines implemented *See Row Information* No, vital signs rechecked  Document  Patient Outcome Other (Comment) (stable after interventions)  Progress note created (see row info) Yes  Assess: SIRS CRITERIA  SIRS Temperature  0  SIRS Pulse 0  SIRS Respirations  0  SIRS WBC 0  SIRS Score Sum  0

## 2021-08-09 NOTE — Progress Notes (Signed)
OT Cancellation Note  Patient Details Name: Joy Alexander MRN: [id number] DOB: [birth year]   Cancelled Treatment:    Reason Eval/Treat Not Completed: Patient at procedure or test/ unavailable. Order received and chart reviewed. Upon arrival to room, pt working with PT. OT to re-attempt at later time as able.   Matthew Folks, OTR/L [facility] [phone]

## 2021-08-09 NOTE — Plan of Care (Signed)
  Problem: Education: Goal: Knowledge of General Education information will improve Description Including pain rating scale, medication(s)/side effects and non-pharmacologic comfort measures Outcome: Progressing   

## 2021-08-09 NOTE — Progress Notes (Signed)
PROGRESS NOTE  Joy Alexander    DOB: 1945/11/14, 75 y.o.  NAT:[id number]  PCP: Lauro Regulus, MD   Code Status: Full Code   DOA: 08/06/2021   LOS: 3  Brief Narrative of Current Hospitalization  Joy Alexander is a 75 y.o. female with a PMH significant for hereditary spastic paraplegia, depression/anxiety. They presented from LTAC to the ED on 08/06/2021 with fall. In the ED, it was found that they had R femoral neck fracture. They were treated with analgesia, antiemetics, IV fluids.  Ortho surgery was consulted.  Patient was admitted to medicine service for further workup and management of chronic conditions as outlined in detail below.  08/09/21 -stable, fixation 12/10  Assessment & Plan  Principal Problem:   Femoral neck fracture (HCC) Active Problems:   Hereditary spastic paraparesis (HCC)   Depression   Fall  R femoral neck fracture-pain is moderately well controlled. Has not been out of bed since surgery. Likely to return to ALF 12/12. - ortho managing  - surgical fixation 12/10 -Analgesia as needed -Antiemetics. -PT/OT - bowel regimen  Anxiety/depression-chronic, stable -Continue home Lexapro  Hereditary spastic paraplegia-chronic, stable -Continue home primidone -Fall precautions  DVT prophylaxis: enoxaparin (LOVENOX) injection 40 mg Start: 08/09/21 0900 SCDs Start: 08/06/21 1518   Diet:  Diet Orders (From admission, onward)     Start     Ordered   08/08/21 1813  Diet Carb Modified Fluid consistency: Thin; Room service appropriate? Yes  Diet effective now       Question Answer Comment  Diet-HS Snack? Nothing   Calorie Level Medium 1600-2000   Fluid consistency: Thin   Room service appropriate? Yes      08/08/21 1812            Subjective 08/09/21    Pt reports feeling well today. She has not been out of bed yet. She is passing flatus but no BM since surgery. Able to tolerate a diet.   Disposition Plan & Communication  Patient  status: Inpatient  Admitted From: ALF Disposition: Assisted living Anticipated discharge date: 12/12  Family Communication: None Consults, Procedures, Significant Events  Consultants:  Ortho surgery  Procedures/significant events:  12/10-IRIF right hip Antimicrobials:  Anti-infectives (From admission, onward)    Start     Dose/Rate Route Frequency Ordered Stop   08/08/21 1600  ceFAZolin (ANCEF) IVPB 1 g/50 mL premix        1 g 100 mL/hr over 30 Minutes Intravenous Every 8 hours 08/08/21 1455 08/09/21 0618   08/08/21 0600  ceFAZolin (ANCEF) IVPB 2g/100 mL premix        2 g 200 mL/hr over 30 Minutes Intravenous On call to O.R. 08/06/21 1757 08/08/21 1050       Objective   Vitals:   08/08/21 1509 08/08/21 1952 08/08/21 2317 08/09/21 0309  BP: (!) 118/57 (!) 107/56 (!) 115/54 (!) 115/54  Pulse: 67 93 88 91  Resp: 15 16 14 14   Temp: 97.6 F (36.4 C) 98.5 F (36.9 C) 98.4 F (36.9 C) 99.9 F (37.7 C)  TempSrc:      SpO2: 95% 95% 93% 95%  Weight:      Height:        Intake/Output Summary (Last 24 hours) at 08/09/2021 0725 Last data filed at 08/09/2021 0700 Gross per 24 hour  Intake 3303.21 ml  Output 2900 ml  Net 403.21 ml    Filed Weights   08/06/21 1308  Weight: 52.4 kg    Patient BMI:  Body mass index is 20.46 kg/m.   Physical Exam: General: awake, alert, NAD Respiratory: normal respiratory effort. Cardiovascular: quick capillary refill  Nervous: A&O x3. no gross focal neurologic deficits, normal speech Extremities: decreased motion of right hip. No gross deformity. normal tone Skin: dry, intact, normal temperature, normal color, No rashes, lesions or ulcers on exposed skin Psychiatry: normal mood, congruent affect  Labs   I have personally reviewed following labs and imaging studies Admission on 08/06/2021  Component Date Value Ref Range Status   SARS Coronavirus 2 by RT PCR 08/06/2021 NEGATIVE  NEGATIVE Final   Influenza A by PCR 08/06/2021 NEGATIVE   NEGATIVE Final   Influenza B by PCR 08/06/2021 NEGATIVE  NEGATIVE Final   Sodium 08/06/2021 137  135 - 145 mmol/L Final   Potassium 08/06/2021 3.9  3.5 - 5.1 mmol/L Final   Chloride 08/06/2021 104  98 - 111 mmol/L Final   CO2 08/06/2021 27  22 - 32 mmol/L Final   Glucose, Bld 08/06/2021 176 (H)  70 - 99 mg/dL Final   BUN 41/74/0814 22  8 - 23 mg/dL Final   Creatinine, Ser 08/06/2021 0.49  0.44 - 1.00 mg/dL Final   Calcium 48/18/5631 8.5 (L)  8.9 - 10.3 mg/dL Final   Total Protein 49/70/2637 6.3 (L)  6.5 - 8.1 g/dL Final   Albumin 85/88/5027 3.8  3.5 - 5.0 g/dL Final   AST 74/07/8785 29  15 - 41 U/L Final   ALT 08/06/2021 28  0 - 44 U/L Final   Alkaline Phosphatase 08/06/2021 67  38 - 126 U/L Final   Total Bilirubin 08/06/2021 0.5  0.3 - 1.2 mg/dL Final   GFR, Estimated 08/06/2021 >60  >60 mL/min Final   Anion gap 08/06/2021 6  5 - 15 Final   Troponin I (High Sensitivity) 08/06/2021 4  <18 ng/L Final   WBC 08/06/2021 5.2  4.0 - 10.5 K/uL Final   RBC 08/06/2021 3.83 (L)  3.87 - 5.11 MIL/uL Final   Hemoglobin 08/06/2021 12.3  12.0 - 15.0 g/dL Final   HCT 76/72/0947 36.8  36.0 - 46.0 % Final   MCV 08/06/2021 96.1  80.0 - 100.0 fL Final   MCH 08/06/2021 32.1  26.0 - 34.0 pg Final   MCHC 08/06/2021 33.4  30.0 - 36.0 g/dL Final   RDW 09/62/8366 12.6  11.5 - 15.5 % Final   Platelets 08/06/2021 161  150 - 400 K/uL Final   nRBC 08/06/2021 0.0  0.0 - 0.2 % Final   Neutrophils Relative % 08/06/2021 71  % Final   Neutro Abs 08/06/2021 3.7  1.7 - 7.7 K/uL Final   Lymphocytes Relative 08/06/2021 17  % Final   Lymphs Abs 08/06/2021 0.9  0.7 - 4.0 K/uL Final   Monocytes Relative 08/06/2021 9  % Final   Monocytes Absolute 08/06/2021 0.5  0.1 - 1.0 K/uL Final   Eosinophils Relative 08/06/2021 1  % Final   Eosinophils Absolute 08/06/2021 0.0  0.0 - 0.5 K/uL Final   Basophils Relative 08/06/2021 1  % Final   Basophils Absolute 08/06/2021 0.1  0.0 - 0.1 K/uL Final   Immature Granulocytes 08/06/2021  1  % Final   Abs Immature Granulocytes 08/06/2021 0.06  0.00 - 0.07 K/uL Final   ABO/RH(D) 08/06/2021 O POS   Final   Antibody Screen 08/06/2021 NEG   Final   Sample Expiration 08/06/2021    Final  Value:08/09/2021,2359 Performed at [facility], [address] [place], [place] [address]    Troponin I (High Sensitivity) 08/06/2021 12  <18 ng/L Final   Glucose-Capillary 08/06/2021 199 (H)  70 - 99 mg/dL Final   Glucose-Capillary 08/07/2021 134 (H)  70 - 99 mg/dL Final   Comment 1 19/14/7829 Notify RN   Final   Comment 2 08/07/2021 Document in Chart   Final   MRSA, PCR 08/07/2021 NEGATIVE  NEGATIVE Final   Staphylococcus aureus 08/07/2021 POSITIVE (A)  NEGATIVE Final   Glucose-Capillary 08/07/2021 149 (H)  70 - 99 mg/dL Final   Comment 1 56/21/3086 Notify RN   Final   Comment 2 08/07/2021 Document in Chart   Final   Sodium 08/08/2021 141  135 - 145 mmol/L Final   Potassium 08/08/2021 4.0  3.5 - 5.1 mmol/L Final   Chloride 08/08/2021 102  98 - 111 mmol/L Final   CO2 08/08/2021 33 (H)  22 - 32 mmol/L Final   Glucose, Bld 08/08/2021 123 (H)  70 - 99 mg/dL Final   BUN 57/84/6962 15  8 - 23 mg/dL Final   Creatinine, Ser 08/08/2021 0.55  0.44 - 1.00 mg/dL Final   Calcium 95/28/4132 8.9  8.9 - 10.3 mg/dL Final   GFR, Estimated 08/08/2021 >60  >60 mL/min Final   Anion gap 08/08/2021 6  5 - 15 Final   WBC 08/08/2021 4.2  4.0 - 10.5 K/uL Final   RBC 08/08/2021 4.08  3.87 - 5.11 MIL/uL Final   Hemoglobin 08/08/2021 12.7  12.0 - 15.0 g/dL Final   HCT 44/08/270 39.3  36.0 - 46.0 % Final   MCV 08/08/2021 96.3  80.0 - 100.0 fL Final   MCH 08/08/2021 31.1  26.0 - 34.0 pg Final   MCHC 08/08/2021 32.3  30.0 - 36.0 g/dL Final   RDW 53/66/4403 12.4  11.5 - 15.5 % Final   Platelets 08/08/2021 125 (L)  150 - 400 K/uL Final   nRBC 08/08/2021 0.0  0.0 - 0.2 % Final   Glucose-Capillary 08/08/2021 199 (H)  70 - 99 mg/dL Final   Color, Urine 47/42/5956 YELLOW (A)  YELLOW Final    APPearance 08/08/2021 CLOUDY (A)  CLEAR Final   Specific Gravity, Urine 08/08/2021 1.021  1.005 - 1.030 Final   pH 08/08/2021 5.0  5.0 - 8.0 Final   Glucose, UA 08/08/2021 >=500 (A)  NEGATIVE mg/dL Final   Hgb urine dipstick 08/08/2021 NEGATIVE  NEGATIVE Final   Bilirubin Urine 08/08/2021 NEGATIVE  NEGATIVE Final   Ketones, ur 08/08/2021 20 (A)  NEGATIVE mg/dL Final   Protein, ur 38/75/6433 NEGATIVE  NEGATIVE mg/dL Final   Nitrite 29/51/8841 NEGATIVE  NEGATIVE Final   Leukocytes,Ua 08/08/2021 LARGE (A)  NEGATIVE Final   RBC / HPF 08/08/2021 21-50  0 - 5 RBC/hpf Final   WBC, UA 08/08/2021 >50 (H)  0 - 5 WBC/hpf Final   Bacteria, UA 08/08/2021 FEW (A)  NONE SEEN Final   Squamous Epithelial / LPF 08/08/2021 NONE SEEN  0 - 5 Final   WBC Clumps 08/08/2021 PRESENT   Final   Mucus 08/08/2021 PRESENT   Final   Budding Yeast 08/08/2021 PRESENT   Final    Imaging Studies  DG Pelvis Portable  Result Date: 08/08/2021 CLINICAL DATA:  Post op EXAM: Radiograph 08/06/2021 COMPARISON:  None. FINDINGS: Interval unipolar RIGHT hip arthroplasty. No fracture or dislocation. Expected soft tissue changes in the lateral hip. IMPRESSION: RIGHT hip arthroplasty.  No complication. Electronically Signed   By:  Genevive Bi M.D.   On: 08/08/2021 14:41   Medications   Scheduled Meds:  acetaminophen  325 mg Oral Q6H   Chlorhexidine Gluconate Cloth  6 each Topical Daily   cholecalciferol  2,000 Units Oral Daily   enoxaparin (LOVENOX) injection  40 mg Subcutaneous Q24H   escitalopram  20 mg Oral Daily   feeding supplement (GLUCERNA SHAKE)  237 mL Oral TID BM   gabapentin  600 mg Oral QHS   multivitamin with minerals  1 tablet Oral Daily   mupirocin ointment  1 application Nasal BID   polyethylene glycol  17 g Oral Daily   primidone  250 mg Oral BID   simvastatin  10 mg Oral QHS   No recently discontinued medications to reconcile  LOS: 3 days   Time spent: >17min  Leeroy Bock, DO [facility] 08/09/2021, 7:25 AM   Please refer to amion to contact the [facility] Attending or Consulting provider for this pt  [email or web address] Available by Epic secure chat 7AM-7PM. If 7PM-7AM, please contact night-coverage

## 2021-08-09 NOTE — TOC Progression Note (Addendum)
Transition of Care [facility]) - Progression Note    Patient Details  Name: AYANNAH FADDIS MRN: [id number] Date of Birth: 1945-10-13  [facility] CM/SW Contact  Liliana Cline, LCSW Phone Number: 08/09/2021, 10:19 AM  Clinical Narrative:   Call to Sue Lush with [facility] to inquire if patient can come back today per MD. Sue Lush stated patient is from their ILF but can come to SNF today if needed, but needs PT eval first.  3:10- PT rec SNF. Patient now not medically ready until tomorrow due do dizziness and blood pressure per MD. Luvenia Heller with [facility] who confirmed patient can come there tomorrow. FL2 completed.       Expected Discharge Plan and Services                                                 Social Determinants of Health (SDOH) Interventions    Readmission Risk Interventions No flowsheet data found.

## 2021-08-10 ENCOUNTER — Encounter: Payer: Self-pay | Admitting: Orthopaedic Surgery

## 2021-08-10 LAB — RESP PANEL BY RT-PCR (FLU A&B, COVID) ARPGX2
Influenza A by PCR: NEGATIVE
Influenza B by PCR: NEGATIVE
SARS Coronavirus 2 by RT PCR: NEGATIVE

## 2021-08-10 MED ORDER — ONDANSETRON HCL 4 MG PO TABS: 4.0000 mg | ORAL_TABLET | Freq: Every day | ORAL | 1 refills | Status: AC | PRN

## 2021-08-10 MED ORDER — HYDROCODONE-ACETAMINOPHEN 5-325 MG PO TABS: 1.0000 | ORAL_TABLET | Freq: Four times a day (QID) | ORAL | 0 refills | Status: AC | PRN

## 2021-08-10 MED ORDER — POLYETHYLENE GLYCOL 3350 17 G PO PACK: 17.0000 g | PACK | Freq: Two times a day (BID) | ORAL | 0 refills | Status: AC

## 2021-08-10 NOTE — Progress Notes (Signed)
Called and gave report to Robin at [facility].  °

## 2021-08-10 NOTE — Progress Notes (Signed)
Physical Therapy Treatment Patient Details Name: Joy Alexander MRN: [id number] DOB: [birth year] Today's Date: 08/10/2021   History of Present Illness 75 y.o. 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 X-ray shows acute impacted right subcapital femoral neck fracture. Now s/p right hip hemiarthroplasty (12/10)    PT Comments    Pt received supine in bed agreeable  to PT. Able to recite post hip precautions with minor cuing. Orthostatics taken supine --> sit --> stand although pt asymptomatic, pt does have a drop in BP (see below). Able to transfer with all bed mobility and STS with supervision, HOB elevated, and increased time with use of bed rails maintaining precautions. Minor cuing provided for hand placement but overall tolerating ability to Stand step transfer to [facility]. RN present in case of orthostatic symptoms as pt in previous notes has required +2 assist when symptomatic. Pt indep with peri hygiene and able to stand and transfer to supine in bed with supervision and minA at LE's returning to supine. Educated and performed HEP with good understanding of form/technique post cuing/education. Overall pt is limited by nausea and denies dizziness today. Will continue to benefit from STR to progress safe ambulation with LRAD.   Orthostatic VS for the past 24 hrs:  BP- Lying Pulse- Lying BP- Sitting Pulse- Sitting  08/09/21 1439 119/57 94 137/61 111    BP standing: 97/51 mm Hg, HR: 96  BPM   Recommendations for follow up therapy are one component of a multi-disciplinary discharge planning process, led by the attending physician.  Recommendations may be updated based on patient status, additional functional criteria and insurance authorization.  Follow Up Recommendations  Skilled nursing-short term rehab (<3 hours/day)     Assistance  Recommended at Discharge Frequent or constant Supervision/Assistance  Equipment Recommendations  None recommended by PT    Recommendations for Other Services       Precautions / Restrictions Precautions Precautions: Posterior Hip Precaution Booklet Issued: Yes (comment) Restrictions Weight Bearing Restrictions: Yes RLE Weight Bearing: Weight bearing as tolerated     Mobility  Bed Mobility Overal bed mobility: Needs Assistance Bed Mobility: Supine to Sit;Sit to Supine     Supine to sit: Supervision;HOB elevated Sit to supine: Min assist   General bed mobility comments: minA at LE's Patient Response: Cooperative  Transfers Overall transfer level: Needs assistance Equipment used: Rolling walker (2 wheels) Transfers: Bed to chair/wheelchair/BSC       Step pivot transfers: Min guard;From elevated surface     General transfer comment: Pt displaying good understanding of precautions with bed mobility. Did not require any physical assist with mobility but did need increased time to perform.    Ambulation/Gait               General Gait Details: deferred due to orthostasis, fatigue, nausea   Stairs             Wheelchair Mobility    Modified Rankin (Stroke Patients Only)       Balance Overall balance assessment: Needs assistance Sitting-balance support: Feet supported;Bilateral upper extremity supported Sitting balance-Leahy Scale: Fair Sitting balance - Comments: Good static sitting at EOB, does rely on BUE support.   Standing balance support: Bilateral upper extremity supported;During functional activity Standing balance-Leahy Scale: Fair Standing balance comment: minguard for standing  Cognition Arousal/Alertness: Awake/alert Behavior During Therapy: WFL for tasks assessed/performed Overall Cognitive Status: Within Functional Limits for tasks assessed                                           Exercises Total Joint Exercises Ankle Circles/Pumps: AROM;Strengthening;Both;20 reps;Supine Quad Sets: AROM;Strengthening;10 reps;Supine;Right Gluteal Sets: AROM;Strengthening;Both;10 reps;Supine Towel Squeeze: AROM;Strengthening;Both;10 reps;Supine Short Arc Quad: AROM;Strengthening;Right;10 reps Heel Slides: AROM;Strengthening;Both;10 reps;Supine Hip ABduction/ADduction: AROM;Strengthening;Both;10 reps;Supine Other Exercises Other Exercises: Re-ed on post hip precautions and HEP (reps, sets, frequency)    General Comments General comments (skin integrity, edema, etc.): No dizziness, remains nauseous.      Pertinent Vitals/Pain Pain Assessment: Faces Faces Pain Scale: Hurts little more Pain Location: Stomach pain and nausea Pain Descriptors / Indicators: Aching Pain Intervention(s): Monitored during session;Premedicated before session;Repositioned    Home Living                          Prior Function            PT Goals (current goals can now be found in the care plan section) Acute Rehab PT Goals Patient Stated Goal: to go back to [facility]. PT Goal Formulation: With patient Time For Goal Achievement: 08/23/21 Potential to Achieve Goals: Good Progress towards PT goals: Progressing toward goals    Frequency    BID      PT Plan Current plan remains appropriate    Co-evaluation              AM-PAC PT "6 Clicks" Mobility   Outcome Measure  Help needed turning from your back to your side while in a flat bed without using bedrails?: A Lot Help needed moving from lying on your back to sitting on the side of a flat bed without using bedrails?: A Lot   Help needed standing up from a chair using your arms (e.g., wheelchair or bedside chair)?: A Lot Help needed to walk in hospital room?: A Lot Help needed climbing 3-5 steps with a railing? : Total 6 Click Score: 9    End of Session Equipment Utilized During Treatment: Gait belt Activity  Tolerance: Treatment limited secondary to medical complications (Comment) Patient left: in bed;with call bell/phone within reach;with bed alarm set Nurse Communication: Mobility status PT Visit Diagnosis: Unsteadiness on feet (R26.81);Other abnormalities of gait and mobility (R26.89);Muscle weakness (generalized) (M62.81);History of falling (Z91.81);Difficulty in walking, not elsewhere classified (R26.2);Pain     Time: 7544-9201 PT Time Calculation (min) (ACUTE ONLY): 36 min  Charges:  $Therapeutic Exercise: 8-22 mins $Therapeutic Activity: 8-22 mins                     Delphia Grates. Fairly IV, PT, DPT Physical Therapist- [place]  [facility]  08/10/2021, 10:54 AM

## 2021-08-10 NOTE — TOC Progression Note (Signed)
Transition of Care [facility]) - Progression Note    Patient Details  Name: Joy Alexander MRN: [id number] Date of Birth: 01-Mar-1946  Transition of Care [facility] CM/SW Contact  Marlowe Sax, RN Phone Number: 08/10/2021, 10:39 AM  Clinical Narrative:   Spoke with the patient and she stated she would feel safer transporting with EMS, she will go to room 103, Nurse to call report to [phone]         Expected Discharge Plan and Services                                                 Social Determinants of Health (SDOH) Interventions    Readmission Risk Interventions No flowsheet data found.

## 2021-08-10 NOTE — Plan of Care (Signed)
  Problem: Education: Goal: Knowledge of General Education information will improve Description: Including pain rating scale, medication(s)/side effects and non-pharmacologic comfort measures Outcome: Progressing   Problem: Health Behavior/Discharge Planning: Goal: Ability to manage health-related needs will improve Outcome: Progressing   Problem: Clinical Measurements: Goal: Ability to maintain clinical measurements within normal limits will improve Outcome: Progressing Goal: Will remain free from infection Outcome: Progressing Goal: Diagnostic test results will improve Outcome: Progressing Goal: Respiratory complications will improve Outcome: Progressing Goal: Cardiovascular complication will be avoided Outcome: Progressing   Problem: Activity: Goal: Risk for activity intolerance will decrease Outcome: Progressing   Problem: Nutrition: Goal: Adequate nutrition will be maintained Outcome: Progressing   Problem: Coping: Goal: Level of anxiety will decrease Outcome: Progressing   Problem: Elimination: Goal: Will not experience complications related to bowel motility Outcome: Progressing Goal: Will not experience complications related to urinary retention Outcome: Progressing   Problem: Pain Managment: Goal: General experience of comfort will improve Outcome: Progressing   Problem: Safety: Goal: Ability to remain free from injury will improve Outcome: Progressing   Problem: Skin Integrity: Goal: Risk for impaired skin integrity will decrease Outcome: Progressing   Problem: Education: Goal: Verbalization of understanding the information provided (i.e., activity precautions, restrictions, etc) will improve Outcome: Progressing Goal: Individualized Educational Video(s) Outcome: Progressing   Problem: Activity: Goal: Ability to ambulate and perform ADLs will improve Outcome: Progressing   Problem: Clinical Measurements: Goal: Postoperative complications will be  avoided or minimized Outcome: Progressing   Problem: Self-Concept: Goal: Ability to maintain and perform role responsibilities to the fullest extent possible will improve Outcome: Progressing   Problem: Pain Management: Goal: Pain level will decrease Outcome: Progressing   Problem: Education: Goal: Knowledge of the prescribed therapeutic regimen will improve Outcome: Progressing Goal: Understanding of discharge needs will improve Outcome: Progressing Goal: Individualized Educational Video(s) Outcome: Progressing   Problem: Activity: Goal: Ability to avoid complications of mobility impairment will improve Outcome: Progressing Goal: Ability to tolerate increased activity will improve Outcome: Progressing   Problem: Clinical Measurements: Goal: Postoperative complications will be avoided or minimized Outcome: Progressing   Problem: Pain Management: Goal: Pain level will decrease with appropriate interventions Outcome: Progressing   Problem: Skin Integrity: Goal: Will show signs of wound healing Outcome: Progressing   

## 2021-08-10 NOTE — Care Management Important Message (Signed)
Important Message  Patient Details  Name: Joy Alexander MRN: [id number] Date of Birth: [birth year]   Medicare Important Message Given:  Yes     Johnell Comings 08/10/2021, 11:47 AM

## 2021-08-10 NOTE — Plan of Care (Signed)
  Problem: Education: Goal: Knowledge of General Education information will improve Description: Including pain rating scale, medication(s)/side effects and non-pharmacologic comfort measures Outcome: Progressing   Problem: Health Behavior/Discharge Planning: Goal: Ability to manage health-related needs will improve Outcome: Progressing   Problem: Clinical Measurements: Goal: Ability to maintain clinical measurements within normal limits will improve Outcome: Progressing Goal: Will remain free from infection Outcome: Progressing Goal: Diagnostic test results will improve Outcome: Progressing Goal: Respiratory complications will improve Outcome: Progressing Goal: Cardiovascular complication will be avoided Outcome: Progressing   Problem: Activity: Goal: Risk for activity intolerance will decrease Outcome: Progressing   Problem: Nutrition: Goal: Adequate nutrition will be maintained Outcome: Progressing   Problem: Coping: Goal: Level of anxiety will decrease Outcome: Progressing   Problem: Elimination: Goal: Will not experience complications related to bowel motility Outcome: Progressing Goal: Will not experience complications related to urinary retention Outcome: Progressing   Problem: Pain Managment: Goal: General experience of comfort will improve Outcome: Progressing   Problem: Safety: Goal: Ability to remain free from injury will improve Outcome: Progressing   Problem: Skin Integrity: Goal: Risk for impaired skin integrity will decrease Outcome: Progressing   Problem: Education: Goal: Verbalization of understanding the information provided (i.e., activity precautions, restrictions, etc) will improve Outcome: Progressing   Problem: Activity: Goal: Ability to ambulate and perform ADLs will improve Outcome: Progressing   Problem: Clinical Measurements: Goal: Postoperative complications will be avoided or minimized Outcome: Progressing   Problem:  Self-Concept: Goal: Ability to maintain and perform role responsibilities to the fullest extent possible will improve Outcome: Progressing   Problem: Pain Management: Goal: Pain level will decrease Outcome: Progressing   Problem: Education: Goal: Knowledge of the prescribed therapeutic regimen will improve Outcome: Progressing Goal: Understanding of discharge needs will improve Outcome: Progressing   Problem: Activity: Goal: Ability to avoid complications of mobility impairment will improve Outcome: Progressing Goal: Ability to tolerate increased activity will improve Outcome: Progressing   Problem: Clinical Measurements: Goal: Postoperative complications will be avoided or minimized Outcome: Progressing   Problem: Pain Management: Goal: Pain level will decrease with appropriate interventions Outcome: Progressing   Problem: Skin Integrity: Goal: Will show signs of wound healing Outcome: Progressing

## 2021-08-10 NOTE — TOC Progression Note (Signed)
Transition of Care [facility]) - Progression Note    Patient Details  Name: Joy Alexander MRN: [id number] Date of Birth: 07-10-1946  [facility]) CM/SW Contact  Marlowe Sax, RN Phone Number: 08/10/2021, 12:08 PM  Clinical Narrative:    Patient going to room 103 at [facility] to transport, The patient is alert and oriented and will notify family, I attempted to call husband and notify of room number, no answer at home  Patient is number 2 on the list      Expected Discharge Plan and Services           Expected Discharge Date: 08/10/21                                     Social Determinants of Health (SDOH) Interventions    Readmission Risk Interventions No flowsheet data found.

## 2021-08-10 NOTE — Discharge Summary (Signed)
Physician Discharge Summary  Joy Alexander:[id number] DOB: 1946-01-10 DOA: 08/06/2021  PCP: Lauro Regulus, MD  Admit date: 08/06/2021 Discharge date: 08/10/2021  Admitted From: ALF Disposition: Assisted living  Recommendations for Outpatient Follow-up:  Follow up with ortho in 2 weeks for staple removal. She is partial weightbearing on R lower extremity for 4 weeks. Posterior hip precautions for 3 months.   Physical therapy Equipment/Devices:walker  Discharge Condition:stable, improved CODE STATUS:  Code Status: Full Code  Regular healthy diet  Brief/Interim Summary: Pt presented after fall that resulted in R femoral neck fracture. She underwent hemiarthroplasty 12/10 which patient tolerated well. Post-op she had some postural dizziness and nausea which is likely associated with pain medications. She was able to transfer to commode with PT. Tolerating PO and having Bms prior to dc. Ortho follow up as above.  Discharge Diagnoses:  Principal Problem:   Femoral neck fracture (HCC) Active Problems:   Hereditary spastic paraparesis (HCC)   Depression   Fall  Allergies as of 08/10/2021       Reactions   Midazolam Nausea And Vomiting   Mobic [meloxicam] Nausea And Vomiting   Tramadol Nausea And Vomiting        Medication List     TAKE these medications    acetaminophen 650 MG CR tablet Commonly known as: TYLENOL Take 650 mg by mouth 2 (two) times a day.   aspirin EC 81 MG tablet Take 81 mg by mouth at bedtime.   carboxymethylcellulose 0.5 % Soln Commonly known as: REFRESH PLUS Place 1 drop into both eyes 3 (three) times daily as needed (dry/irritated eyes.).   escitalopram 20 MG tablet Commonly known as: LEXAPRO Take 20 mg by mouth daily.   gabapentin 300 MG capsule Commonly known as: NEURONTIN Take 600 mg by mouth at bedtime.   HYDROcodone-acetaminophen 5-325 MG tablet Commonly known as: NORCO/VICODIN Take 1 tablet by mouth every 6 (six)  hours as needed for up to 3 days for moderate pain.   ibandronate 150 MG tablet Commonly known as: BONIVA Take 150 mg by mouth every 30 (thirty) days. Take in the morning with a full glass of water, on an empty stomach, and do not take anything else by mouth or lie down for the next 30 min.   ibuprofen 200 MG tablet Commonly known as: ADVIL Take 200 mg by mouth every 8 (eight) hours as needed (pain).   ondansetron 4 MG tablet Commonly known as: Zofran Take 1 tablet (4 mg total) by mouth daily as needed for nausea or vomiting.   polyethylene glycol 17 g packet Commonly known as: MIRALAX / GLYCOLAX Take 17 g by mouth 2 (two) times daily.   primidone 250 MG tablet Commonly known as: MYSOLINE Take 250 mg by mouth 2 (two) times daily.   simvastatin 10 MG tablet Commonly known as: ZOCOR Take 10 mg by mouth at bedtime.   Vitamin D3 50 MCG (2000 UT) Tabs Take 2,000 Units by mouth daily.        Allergies  Allergen Reactions   Midazolam Nausea And Vomiting   Mobic [Meloxicam] Nausea And Vomiting   Tramadol Nausea And Vomiting    Consultations: Ortho surgery  Procedures/Studies: DG Pelvis Portable  Result Date: 08/08/2021 CLINICAL DATA:  Post op EXAM: Radiograph 08/06/2021 COMPARISON:  None. FINDINGS: Interval unipolar RIGHT hip arthroplasty. No fracture or dislocation. Expected soft tissue changes in the lateral hip. IMPRESSION: RIGHT hip arthroplasty.  No complication. Electronically Signed   By: Loura Halt.D.  On: 08/08/2021 14:41   CT Hip Right Wo Contrast  Result Date: 08/06/2021 CLINICAL DATA:  Hip trauma, fracture suspected, xray done EXAM: CT OF THE RIGHT HIP WITHOUT CONTRAST TECHNIQUE: Multidetector CT imaging of the right hip was performed according to the standard protocol. Multiplanar CT image reconstructions were also generated. COMPARISON:  Same-day x-ray FINDINGS: Bones/Joint/Cartilage Acute impacted fracture of the right femoral neck, predominantly  subcapital. Mild anterior apex angulation. Slight external rotation. No fracture involvement of the intertrochanteric region. No evidence of fracture involvement of the femoral head articular surface. No dislocation. Subacute healing nondisplaced fracture of the mid right inferior pubic ramus with callus formation. No additional fractures. No suspicious bone lesion. Ligaments Suboptimally assessed by CT. Muscles and Tendons No acute musculotendinous abnormality by CT. Soft tissues No soft tissue hematoma. No right inguinal lymphadenopathy. Scattered atherosclerotic calcifications. IMPRESSION: 1. Acute impacted fracture of the right femoral neck, predominantly subcapital. 2. Subacute healing nondisplaced fracture of the mid right inferior pubic ramus. Electronically Signed   By: Duanne Guess D.O.   On: 08/06/2021 15:49   DG Chest Port 1 View  Result Date: 08/06/2021 CLINICAL DATA:  Provided history: Preop EXAM: PORTABLE CHEST 1 VIEW COMPARISON:  No pertinent prior exams available for comparison. FINDINGS: Heart size within normal limits. Aortic atherosclerosis. No appreciable airspace consolidation. No evidence of pleural effusion or pneumothorax. No acute bony abnormality identified. Thoracic dextrocurvature. IMPRESSION: No evidence of acute cardiopulmonary abnormality. Aortic Atherosclerosis (ICD10-I70.0). Thoracic dextrocurvature. Electronically Signed   By: Jackey Loge D.O.   On: 08/06/2021 15:59   DG Knee Complete 4 Views Right  Result Date: 08/06/2021 CLINICAL DATA:  Provided history: Fall with right hip and knee pain. Additional history provided: Severe pain to right hip and right knee. EXAM: RIGHT KNEE - COMPLETE 4+ VIEW COMPARISON:  Report from radiographs of the right knee 04/23/2016 (images unavailable). FINDINGS: There is normal bony alignment. No evidence of acute osseous or articular abnormality. Mild medial and lateral tibiofemoral compartment joint space narrowing. Partially imaged  chronic, healed fracture deformity of the fibular diaphysis. Vascular calcifications. IMPRESSION: No evidence of acute osseous or articular abnormality. Electronically Signed   By: Jackey Loge D.O.   On: 08/06/2021 14:32   DG Hip Unilat With Pelvis 2-3 Views Right  Result Date: 08/06/2021 CLINICAL DATA:  Right hip pain after fall. EXAM: DG HIP (WITH OR WITHOUT PELVIS) 2-3V RIGHT COMPARISON:  Right hip x-ray report dated April 22, 2021. CT abdomen pelvis dated May 22, 2019. FINDINGS: Acute impacted right subcapital femoral neck fracture. Healing subacute fracture of the right inferior pubic ramus. Old healed fracture of the left inferior pubic ramus. The hip joint spaces are preserved. Osteopenia. Soft tissues are unremarkable. IMPRESSION: 1. Acute impacted right subcapital femoral neck fracture. 2. Subacute, healing fracture of the right inferior pubic ramus. Electronically Signed   By: Obie Dredge M.D.   On: 08/06/2021 14:32    Subjective: Patient's pain is well controlled. She feels weak and is looking forward to working with PT. Had 2 Bms overnight. Ready to be discharged.   Discharge Exam: Vitals:   08/10/21 0406 08/10/21 0748  BP: (!) 115/59 (!) 117/54  Pulse: 88 88  Resp: 20 14  Temp: 99.8 F (37.7 C) 98.5 F (36.9 C)  SpO2: 97% 95%    General: Pt is alert, awake, not in acute distress Cardiovascular: RRR, S1/S2 +, no rubs, no gallops Respiratory: CTA bilaterally, no wheezing, no rhonchi Abdominal: Soft, NT, ND, bowel sounds + Extremities: no  edema, no cyanosis  Labs: Basic Metabolic Panel: Recent Labs  Lab 08/06/21 1316 08/08/21 0455  NA 137 141  K 3.9 4.0  CL 104 102  CO2 27 33*  GLUCOSE 176* 123*  BUN 22 15  CREATININE 0.49 0.55  CALCIUM 8.5* 8.9   CBC: Recent Labs  Lab 08/06/21 1316 08/08/21 0455  WBC 5.2 4.2  NEUTROABS 3.7  --   HGB 12.3 12.7  HCT 36.8 39.3  MCV 96.1 96.3  PLT 161 125*    Microbiology Recent Results (from the past 240  hour(s))  Resp Panel by RT-PCR (Flu A&B, Covid) Nasopharyngeal Swab     Status: None   Collection Time: 08/06/21  3:37 PM   Specimen: Nasopharyngeal Swab; Nasopharyngeal(NP) swabs in vial transport medium  Result Value Ref Range Status   SARS Coronavirus 2 by RT PCR NEGATIVE NEGATIVE Final    Comment: (NOTE) SARS-CoV-2 target nucleic acids are NOT DETECTED.  The SARS-CoV-2 RNA is generally detectable in upper respiratory specimens during the acute phase of infection. The lowest concentration of SARS-CoV-2 viral copies this assay can detect is 138 copies/mL. A negative result does not preclude SARS-Cov-2 infection and should not be used as the sole basis for treatment or other patient management decisions. A negative result may occur with  improper specimen collection/handling, submission of specimen other than nasopharyngeal swab, presence of viral mutation(s) within the areas targeted by this assay, and inadequate number of viral copies(<138 copies/mL). A negative result must be combined with clinical observations, patient history, and epidemiological information. The expected result is Negative.  Fact Sheet for Patients:  BloggerCourse.com  Fact Sheet for Healthcare Providers:  SeriousBroker.it  This test is no t yet approved or cleared by the [place] FDA and  has been authorized for detection and/or diagnosis of SARS-CoV-2 by FDA under an Emergency Use Authorization (EUA). This EUA will remain  in effect (meaning this test can be used) for the duration of the COVID-19 declaration under Section 564(b)(1) of the Act, 21 U.S.C.section 360bbb-3(b)(1), unless the authorization is terminated  or revoked sooner.       Influenza A by PCR NEGATIVE NEGATIVE Final   Influenza B by PCR NEGATIVE NEGATIVE Final    Comment: (NOTE) The Xpert Xpress SARS-CoV-2/FLU/RSV plus assay is intended as an aid in the diagnosis of influenza from  Nasopharyngeal swab specimens and should not be used as a sole basis for treatment. Nasal washings and aspirates are unacceptable for Xpert Xpress SARS-CoV-2/FLU/RSV testing.  Fact Sheet for Patients: BloggerCourse.com  Fact Sheet for Healthcare Providers: SeriousBroker.it  This test is not yet approved or cleared by the [place] FDA and has been authorized for detection and/or diagnosis of SARS-CoV-2 by FDA under an Emergency Use Authorization (EUA). This EUA will remain in effect (meaning this test can be used) for the duration of the COVID-19 declaration under Section 564(b)(1) of the Act, 21 U.S.C. section 360bbb-3(b)(1), unless the authorization is terminated or revoked.  Performed at [facility], [address] [place], [place] [address]   Surgical PCR screen     Status: Abnormal   Collection Time: 08/07/21 11:00 AM   Specimen: Nasal Mucosa; Nasal Swab  Result Value Ref Range Status   MRSA, PCR NEGATIVE NEGATIVE Final   Staphylococcus aureus POSITIVE (A) NEGATIVE Final    Comment: (NOTE) The Xpert SA Assay (FDA approved for NASAL specimens in patients 54 years of age and older), is one component of a comprehensive surveillance program. It is not  intended to diagnose infection nor to guide or monitor treatment. Performed at [facility], [address] [place], [place] [address]     Time coordinating discharge: Over 30 minutes  Leeroy Bock, MD  [facility] 08/10/2021, 9:11 AM

## 2021-08-11 DIAGNOSIS — R251 Tremor, unspecified: Secondary | ICD-10-CM

## 2021-08-11 DIAGNOSIS — E119 Type 2 diabetes mellitus without complications: Secondary | ICD-10-CM | POA: Diagnosis not present

## 2021-08-11 DIAGNOSIS — S72001A Fracture of unspecified part of neck of right femur, initial encounter for closed fracture: Secondary | ICD-10-CM | POA: Diagnosis not present

## 2021-08-11 DIAGNOSIS — F39 Unspecified mood [affective] disorder: Secondary | ICD-10-CM | POA: Diagnosis not present

## 2021-08-11 DIAGNOSIS — G114 Hereditary spastic paraplegia: Secondary | ICD-10-CM | POA: Diagnosis not present

## 2021-08-11 LAB — SURGICAL PATHOLOGY

## 2021-09-03 DIAGNOSIS — R2241 Localized swelling, mass and lump, right lower limb: Secondary | ICD-10-CM | POA: Diagnosis not present

## 2021-09-07 DIAGNOSIS — G609 Hereditary and idiopathic neuropathy, unspecified: Secondary | ICD-10-CM | POA: Diagnosis not present

## 2021-09-07 DIAGNOSIS — E119 Type 2 diabetes mellitus without complications: Secondary | ICD-10-CM | POA: Diagnosis not present

## 2021-09-07 DIAGNOSIS — S7291XD Unspecified fracture of right femur, subsequent encounter for closed fracture with routine healing: Secondary | ICD-10-CM | POA: Diagnosis not present

## 2021-09-07 DIAGNOSIS — G2589 Other specified extrapyramidal and movement disorders: Secondary | ICD-10-CM

## 2021-09-07 DIAGNOSIS — F39 Unspecified mood [affective] disorder: Secondary | ICD-10-CM | POA: Diagnosis not present

## 2021-10-19 ENCOUNTER — Encounter: Payer: Self-pay | Admitting: Podiatry

## 2021-10-19 ENCOUNTER — Other Ambulatory Visit: Payer: Self-pay

## 2021-10-19 ENCOUNTER — Ambulatory Visit (INDEPENDENT_AMBULATORY_CARE_PROVIDER_SITE_OTHER): Payer: Medicare Other | Admitting: Podiatry

## 2021-10-19 DIAGNOSIS — M79675 Pain in left toe(s): Secondary | ICD-10-CM

## 2021-10-19 DIAGNOSIS — M79674 Pain in right toe(s): Secondary | ICD-10-CM | POA: Diagnosis not present

## 2021-10-19 DIAGNOSIS — B351 Tinea unguium: Secondary | ICD-10-CM

## 2021-10-19 DIAGNOSIS — E119 Type 2 diabetes mellitus without complications: Secondary | ICD-10-CM

## 2021-10-19 NOTE — Progress Notes (Signed)
°  Subjective:  Patient ID: Joy Alexander, female    DOB: [birth year],  MRN: [id number]  Chief Complaint  Patient presents with   Diabetes   Nail Problem    Nail trim, [facility]      76 y.o. female presents with the above complaint. History confirmed with patient.  She has type 2 diabetes and is no longer taking oral medications for this.  Her A1c is 5.4%.  Her nails are thickened and elongated especially the right hallux and left third toenail although the others are bothersome as well she has not been able to cut them due to recent hip fracture.  Objective:  Physical Exam: warm, good capillary refill, no trophic changes or ulcerative lesions, normal DP and PT pulses, and normal sensory exam. Left Foot: dystrophic yellowed discolored nail plates with subungual debris Right Foot: dystrophic yellowed discolored nail plates with subungual debris   Assessment:   1. Pain due to onychomycosis of toenails of both feet      Plan:  Patient was evaluated and treated and all questions answered.  Discussed the etiology and treatment options for the condition in detail with the patient. Educated patient on the topical and oral treatment options for mycotic nails. Recommended debridement of the nails today. Sharp and mechanical debridement performed of all painful and mycotic nails today. Nails debrided in length and thickness using a nail nipper to level of comfort. Discussed treatment options including appropriate shoe gear. Follow up as needed for painful nails.    Return if symptoms worsen or fail to improve.

## 2022-03-03 IMAGING — CR DG KNEE COMPLETE 4+V*R*
4 series · 4 of 4 positions shown · non-contrast
Comparison: Report from radiographs of the right knee 04/23/2016
(images unavailable).

CLINICAL DATA: Provided history: Fall with right hip and knee pain.
Additional history provided: Severe pain to right hip and right
knee.

EXAM:
RIGHT KNEE - COMPLETE 4+ VIEW

[knee ap]
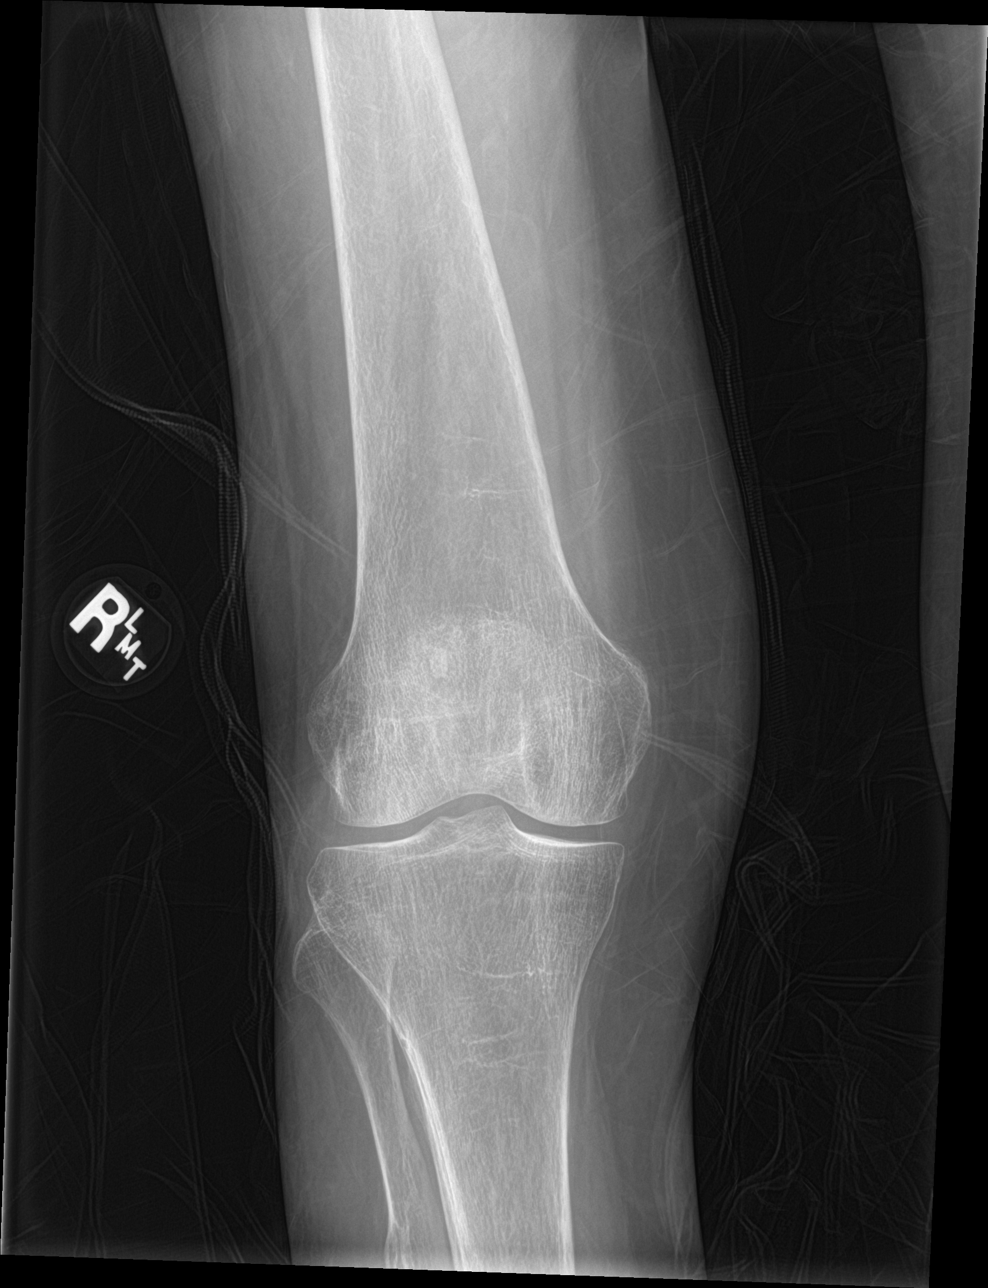

[knee obl (1 of 2)]
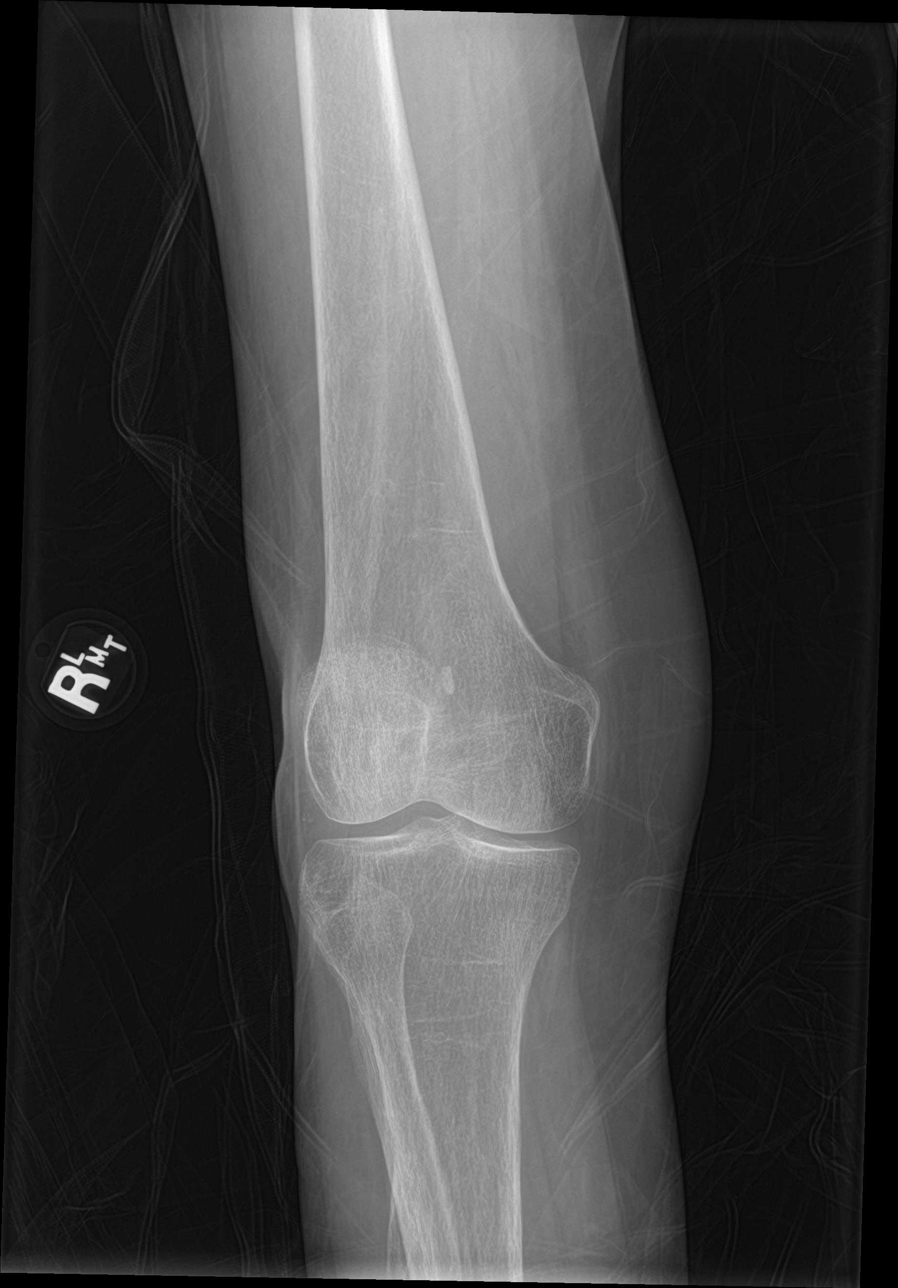

[knee obl (2 of 2)]
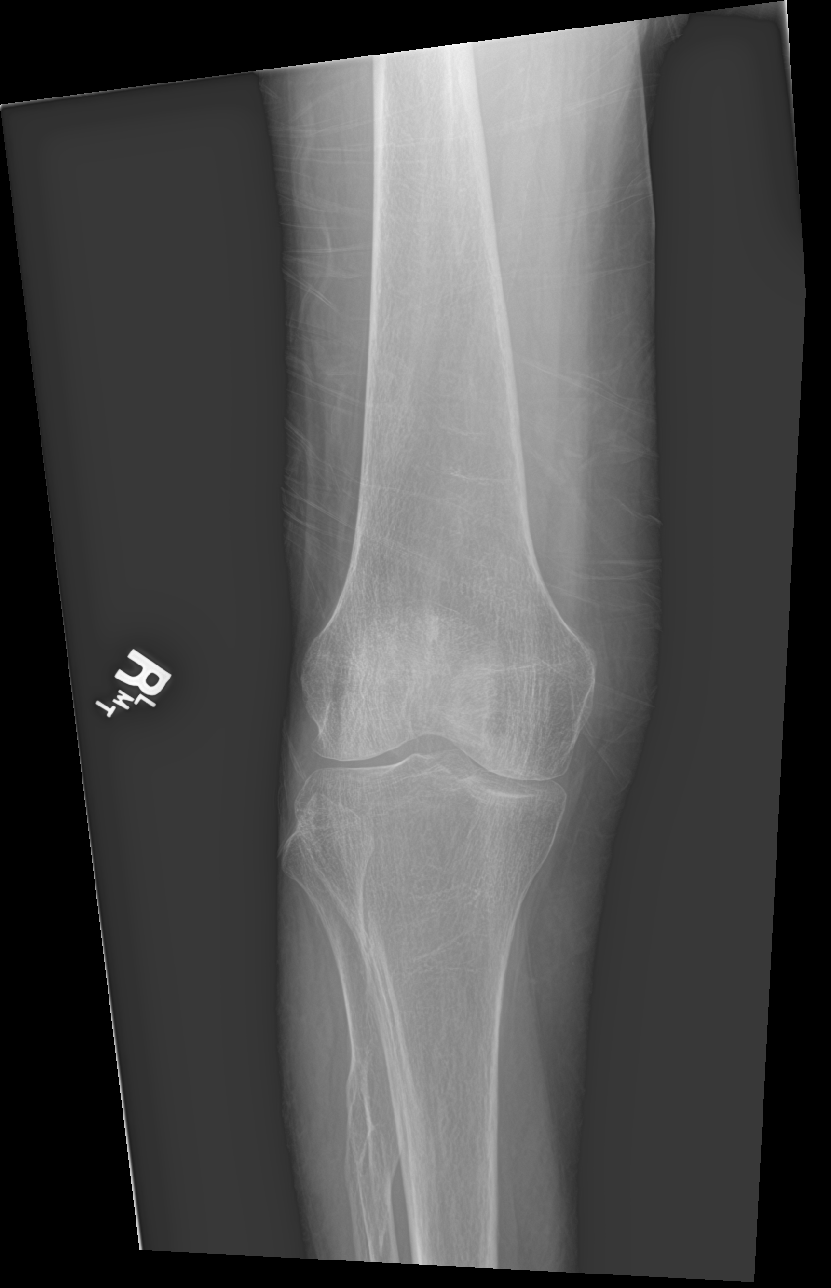

[knee lat]
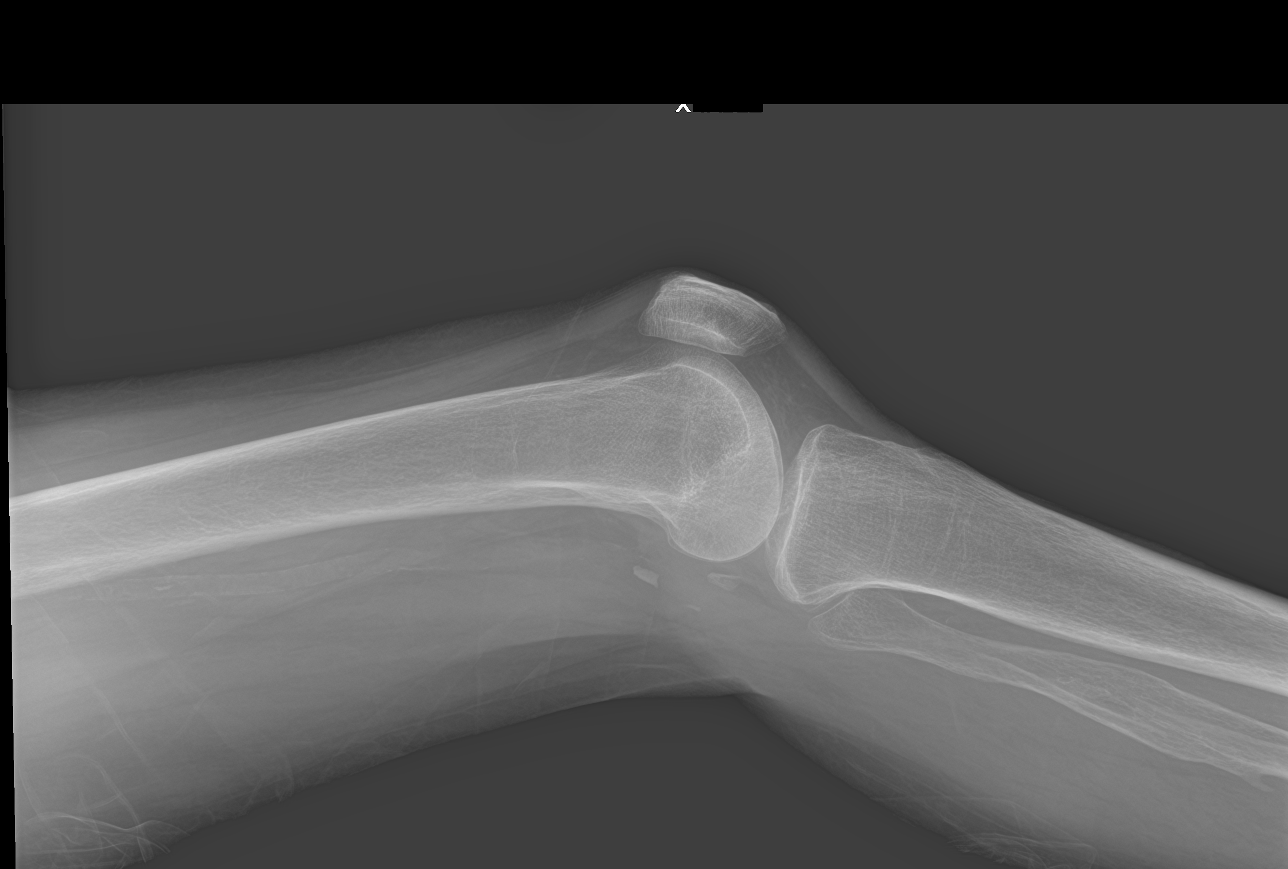

[4 of 4 positions shown; findings below may reference images not displayed]

FINDINGS: There is normal bony alignment.

No evidence of acute osseous or articular abnormality.

Mild medial and lateral tibiofemoral compartment joint space
narrowing.

Partially imaged chronic, healed fracture deformity of the fibular
diaphysis.

Vascular calcifications.
IMPRESSION: No evidence of acute osseous or articular abnormality.

## 2022-04-22 ENCOUNTER — Other Ambulatory Visit: Payer: Self-pay | Admitting: Internal Medicine

## 2022-04-22 DIAGNOSIS — Z1231 Encounter for screening mammogram for malignant neoplasm of breast: Secondary | ICD-10-CM

## 2022-05-27 ENCOUNTER — Ambulatory Visit
Admission: RE | Admit: 2022-05-27 | Discharge: 2022-05-27 | Disposition: A | Discharge status: Home / Self Care | Payer: Medicare Other | Source: Ambulatory Visit | Attending: Internal Medicine | Admitting: Internal Medicine

## 2022-05-27 DIAGNOSIS — Z1231 Encounter for screening mammogram for malignant neoplasm of breast: Secondary | ICD-10-CM | POA: Insufficient documentation

## 2023-05-30 ENCOUNTER — Other Ambulatory Visit: Payer: Self-pay | Admitting: Internal Medicine

## 2023-05-30 DIAGNOSIS — Z1231 Encounter for screening mammogram for malignant neoplasm of breast: Secondary | ICD-10-CM

## 2023-06-08 ENCOUNTER — Ambulatory Visit
Admission: RE | Admit: 2023-06-08 | Discharge: 2023-06-08 | Disposition: A | Discharge status: Home / Self Care | Payer: Medicare Other | Source: Ambulatory Visit | Attending: Internal Medicine | Admitting: Internal Medicine

## 2023-06-08 DIAGNOSIS — Z1231 Encounter for screening mammogram for malignant neoplasm of breast: Secondary | ICD-10-CM | POA: Diagnosis present

## 2024-06-08 ENCOUNTER — Other Ambulatory Visit: Payer: Self-pay | Admitting: Internal Medicine

## 2024-06-08 DIAGNOSIS — Z1231 Encounter for screening mammogram for malignant neoplasm of breast: Secondary | ICD-10-CM

## 2024-07-09 ENCOUNTER — Ambulatory Visit
Admission: RE | Admit: 2024-07-09 | Discharge: 2024-07-09 | Disposition: A | Discharge status: Home / Self Care | Source: Ambulatory Visit | Attending: Internal Medicine | Admitting: Internal Medicine

## 2024-07-09 DIAGNOSIS — Z1231 Encounter for screening mammogram for malignant neoplasm of breast: Secondary | ICD-10-CM | POA: Insufficient documentation
